# Patient Record
Sex: Female | Born: 1985 | Race: White | Hispanic: No | Marital: Single | Smoking: Never smoker
Health system: Southern US, Community
[De-identification: ages and names within clinical notes are randomized; demographics above are authoritative.]

## PROBLEM LIST (undated history)

## (undated) DIAGNOSIS — F909 Attention-deficit hyperactivity disorder, unspecified type: Secondary | ICD-10-CM

## (undated) DIAGNOSIS — I10 Essential (primary) hypertension: Secondary | ICD-10-CM

## (undated) HISTORY — PX: TUBAL LIGATION: SHX77

---

## 2005-12-17 HISTORY — PX: TUBAL LIGATION: SHX77

## 2017-12-17 DIAGNOSIS — I499 Cardiac arrhythmia, unspecified: Secondary | ICD-10-CM

## 2017-12-17 HISTORY — DX: Cardiac arrhythmia, unspecified: I49.9

## 2018-01-13 ENCOUNTER — Emergency Department (HOSPITAL_COMMUNITY)

## 2018-01-13 ENCOUNTER — Emergency Department (HOSPITAL_COMMUNITY)
Admission: EM | Admit: 2018-01-13 | Discharge: 2018-01-13 | Disposition: A | Discharge status: Home / Self Care | Attending: Emergency Medicine | Admitting: Emergency Medicine

## 2018-01-13 ENCOUNTER — Encounter (HOSPITAL_COMMUNITY): Payer: Self-pay

## 2018-01-13 ENCOUNTER — Other Ambulatory Visit: Payer: Self-pay

## 2018-01-13 DIAGNOSIS — R110 Nausea: Secondary | ICD-10-CM | POA: Insufficient documentation

## 2018-01-13 DIAGNOSIS — R61 Generalized hyperhidrosis: Secondary | ICD-10-CM | POA: Insufficient documentation

## 2018-01-13 DIAGNOSIS — R0789 Other chest pain: Secondary | ICD-10-CM | POA: Insufficient documentation

## 2018-01-13 DIAGNOSIS — R079 Chest pain, unspecified: Secondary | ICD-10-CM

## 2018-01-13 DIAGNOSIS — R42 Dizziness and giddiness: Secondary | ICD-10-CM | POA: Insufficient documentation

## 2018-01-13 LAB — BASIC METABOLIC PANEL
Anion gap: 10 (ref 5–15)
BUN: 10 mg/dL (ref 6–20)
CALCIUM: 9.3 mg/dL (ref 8.9–10.3)
CO2: 24 mmol/L (ref 22–32)
CREATININE: 0.69 mg/dL (ref 0.44–1.00)
Chloride: 102 mmol/L (ref 101–111)
Glucose, Bld: 102 mg/dL — ABNORMAL HIGH (ref 65–99)
Potassium: 3.9 mmol/L (ref 3.5–5.1)
Sodium: 136 mmol/L (ref 135–145)

## 2018-01-13 LAB — CBC
HCT: 38.1 % (ref 36.0–46.0)
Hemoglobin: 12.2 g/dL (ref 12.0–15.0)
MCH: 27.4 pg (ref 26.0–34.0)
MCHC: 32 g/dL (ref 30.0–36.0)
MCV: 85.6 fL (ref 78.0–100.0)
PLATELETS: 253 10*3/uL (ref 150–400)
RBC: 4.45 MIL/uL (ref 3.87–5.11)
RDW: 14.5 % (ref 11.5–15.5)
WBC: 8.5 10*3/uL (ref 4.0–10.5)

## 2018-01-13 LAB — I-STAT BETA HCG BLOOD, ED (MC, WL, AP ONLY)

## 2018-01-13 LAB — I-STAT TROPONIN, ED
TROPONIN I, POC: 0 ng/mL (ref 0.00–0.08)
Troponin i, poc: 0.01 ng/mL (ref 0.00–0.08)

## 2018-01-13 MED ORDER — IBUPROFEN 800 MG PO TABS
800.0000 mg | ORAL_TABLET | Freq: Once | ORAL | Status: AC
  Administered 2018-01-13: 800 mg via ORAL
  Filled 2018-01-13: qty 1

## 2018-01-13 MED ORDER — PANTOPRAZOLE SODIUM 20 MG PO TBEC: 20.0000 mg | DELAYED_RELEASE_TABLET | Freq: Every day | ORAL | 1 refills | Status: AC

## 2018-01-13 NOTE — ED Triage Notes (Signed)
[facility]- pt coming from home with complaint of chest pain. Pt has no cardiac hx. Had 324 of aspirin PTA and 1 nitro which decreased chest pain but caused a headache. Pt AOX4. EKG unremarkable.

## 2018-01-13 NOTE — ED Provider Notes (Signed)
[facility] EMERGENCY DEPARTMENT Provider Note   CSN: [id number] Arrival date & time: 01/13/18  1337     History   Chief Complaint Chief Complaint  Patient presents with  . Chest Pain    HPI Gabriela Herrera is a 32 y.o. female.  32 year old female presents to the emergency department for evaluation of chest pain.  She reports onset of chest pain shortly after noon today.  Pain was sharp, intense, and present in her left chest above her breast.  Pain radiated slightly toward her left shoulder.  She states that pain lasted for a few minutes before improving.  It has been waxing and waning since this time without known aggravating factors.  Pain, overall, has improved.  She was given 324 mg aspirin by EMS as well as 1 sublingual nitroglycerin tablet.  Patient reports some improvement in her pain following oral nitroglycerin.  She states that she has had similar chest pain a few times a month for the past few years.  She is unsure of what causes onset of her discomfort, but today felt more severe.  Symptoms associated with nausea, diaphoresis, lightheadedness.  She has had no syncope, hemoptysis, leg swelling, fevers.  She denies the use of birth control.  No recent surgeries or hospitalizations.  No personal history of hypertension, dyslipidemia, diabetes.  No immediate family history of CAD or ACS.      No past medical history on file.  There are no active problems to display for this patient.   ** The histories are not reviewed yet. Please review them in the "History" navigator section and refresh this [place].  OB History    No data available       Home Medications    Prior to Admission medications   Medication Sig Start Date End Date Taking? Authorizing Provider  pantoprazole (PROTONIX) 20 MG tablet Take 1 tablet (20 mg total) by mouth daily. 01/13/18   Antonietta Breach, PA-C    Family History No family history on file.  Social History Social History    Tobacco Use  . Smoking status: Never Smoker  . Smokeless tobacco: Never Used  Substance Use Topics  . Alcohol use: No    Frequency: Never  . Drug use: No     Allergies   Codeine   Review of Systems Review of Systems Ten systems reviewed and are negative for acute change, except as noted in the HPI.    Physical Exam Updated Vital Signs BP 130/86   Pulse 64   Temp 98 F (36.7 C) (Oral)   Resp (!) 21   Ht 5\' 8"  (1.727 m)   Wt 89.8 kg (198 lb)   LMP 01/01/2018 (Within Days)   SpO2 99%   BMI 30.11 kg/m   Physical Exam  Constitutional: She is oriented to person, place, and time. She appears well-developed and well-nourished. No distress.  Nontoxic appearing and in no acute distress  HENT:  Head: Normocephalic and atraumatic.  Eyes: Conjunctivae and EOM are normal. No scleral icterus.  Neck: Normal range of motion.  Cardiovascular: Normal rate, regular rhythm and intact distal pulses.  Pulmonary/Chest: Effort normal. No stridor. No respiratory distress. She has no wheezes. She has no rales.  Respirations even and unlabored.  Lungs clear to auscultation bilaterally.  Musculoskeletal: Normal range of motion.  No bilateral lower extremity edema  Neurological: She is alert and oriented to person, place, and time. She exhibits normal muscle tone. Coordination normal.  Skin: Skin is warm  and dry. No rash noted. She is not diaphoretic. No erythema. No pallor.  Psychiatric: She has a normal mood and affect. Her behavior is normal.  Nursing note and vitals reviewed.    ED Treatments / Results  Labs (all labs ordered are listed, but only abnormal results are displayed) Labs Reviewed  BASIC METABOLIC PANEL - Abnormal; Notable for the following components:      Result Value   Glucose, Bld 102 (*)    All other components within normal limits  CBC  I-STAT TROPONIN, ED  I-STAT BETA HCG BLOOD, ED ([facility], [facility], [facility] ONLY)  I-STAT TROPONIN, ED    EKG  EKG  Interpretation  Date/Time:  Monday January 13 2018 13:53:32 EST Ventricular Rate:  67 PR Interval:  154 QRS Duration: 76 QT Interval:  384 QTC Calculation: 405 R Axis:   24 Text Interpretation:  Normal sinus rhythm Normal ECG NO STEMI No old tracing to compare Confirmed by Addison Lank [phone]) on 01/13/2018 3:42:14 PM       Radiology Dg Chest 2 View  Result Date: 01/13/2018 CLINICAL DATA:  Hervey Ard left-sided chest pain. EXAM: CHEST  2 VIEW COMPARISON:  None. FINDINGS: The heart size and mediastinal contours are within normal limits. Both lungs are clear. The visualized skeletal structures are unremarkable. IMPRESSION: No active cardiopulmonary disease. Electronically Signed   By: San Morelle M.D.   On: 01/13/2018 15:37    Procedures Procedures (including critical care time)  Medications Ordered in ED Medications  ibuprofen (ADVIL,MOTRIN) tablet 800 mg (800 mg Oral Given 01/13/18 2055)     Initial Impression / Assessment and Plan / ED Course  I have reviewed the triage vital signs and the nursing notes.  Pertinent labs & imaging results that were available during my care of the patient were reviewed by me and considered in my medical decision making (see chart for details).     32 year old female presents to the emergency department for evaluation of chest pain.  Pain is chronic and intermittent, occurring every 1-2 weeks.  She states the pain became more severe today.  Patient with heart score consistent with low risk of acute coronary event.  Her cardiac workup today is reassuring with a negative troponin x2.  EKG shows no signs of acute ischemia.  Chest x-ray offers further reassurance without significant cardiopulmonary abnormality.  Patient has no mediastinal widening to suggest dissection.  She is PERC negative with low concern for pulmonary embolus at this time.  Given chronicity of symptoms, plan for outpatient follow-up with cardiology.  The patient has been given  a referral to [facility] as well as a financial assist resource guide to assist her in finding a primary care doctor.  She reports a history of reflux.  Unable to exclude this as cause of her pain today.  Will start on Protonix.  Have also discussed the possibility of Prinzmetal angina with the patient.  She verbalizes understanding.  MSK thought less likely as symptoms not reproducible on palpation.  Return precautions discussed and provided.  Patient discharged in stable condition with no unaddressed concerns.   Final Clinical Impressions(s) / ED Diagnoses   Final diagnoses:  Nonspecific chest pain    ED Discharge Orders        Ordered    pantoprazole (PROTONIX) 20 MG tablet  Daily     01/13/18 2108       Antonietta Breach, PA-C 01/13/18 2220    Duffy Bruce, MD 01/13/18 2229

## 2018-01-13 NOTE — Discharge Instructions (Signed)
Your workup did not reveal a concerning cause of your chest pain today.  We recommend that you follow-up with a primary care doctor as well as a cardiologist for further evaluation of symptoms.  Given your history of reflux, we advised that you take Protonix daily as prescribed.  You may return to the emergency department, as needed, for new or concerning symptoms.

## 2018-08-14 ENCOUNTER — Emergency Department (HOSPITAL_COMMUNITY)
Admission: EM | Admit: 2018-08-14 | Discharge: 2018-08-15 | Disposition: A | Discharge status: Home / Self Care | Attending: Emergency Medicine | Admitting: Emergency Medicine

## 2018-08-14 ENCOUNTER — Other Ambulatory Visit: Payer: Self-pay

## 2018-08-14 ENCOUNTER — Encounter (HOSPITAL_COMMUNITY): Payer: Self-pay | Admitting: Emergency Medicine

## 2018-08-14 ENCOUNTER — Emergency Department (HOSPITAL_COMMUNITY)

## 2018-08-14 DIAGNOSIS — R51 Headache: Secondary | ICD-10-CM | POA: Insufficient documentation

## 2018-08-14 DIAGNOSIS — Z79899 Other long term (current) drug therapy: Secondary | ICD-10-CM | POA: Diagnosis not present

## 2018-08-14 DIAGNOSIS — I10 Essential (primary) hypertension: Secondary | ICD-10-CM | POA: Insufficient documentation

## 2018-08-14 DIAGNOSIS — R519 Headache, unspecified: Secondary | ICD-10-CM

## 2018-08-14 HISTORY — DX: Essential (primary) hypertension: I10

## 2018-08-14 LAB — DIFFERENTIAL
Abs Immature Granulocytes: 0 10*3/uL (ref 0.0–0.1)
BASOS ABS: 0 10*3/uL (ref 0.0–0.1)
BASOS PCT: 0 %
EOS PCT: 1 %
Eosinophils Absolute: 0.1 10*3/uL (ref 0.0–0.7)
IMMATURE GRANULOCYTES: 0 %
Lymphocytes Relative: 42 %
Lymphs Abs: 4.1 10*3/uL — ABNORMAL HIGH (ref 0.7–4.0)
Monocytes Absolute: 0.6 10*3/uL (ref 0.1–1.0)
Monocytes Relative: 6 %
Neutro Abs: 5 10*3/uL (ref 1.7–7.7)
Neutrophils Relative %: 51 %

## 2018-08-14 LAB — I-STAT TROPONIN, ED: Troponin i, poc: 0.02 ng/mL (ref 0.00–0.08)

## 2018-08-14 LAB — COMPREHENSIVE METABOLIC PANEL
ALT: 19 U/L (ref 0–44)
AST: 20 U/L (ref 15–41)
Albumin: 3.9 g/dL (ref 3.5–5.0)
Alkaline Phosphatase: 56 U/L (ref 38–126)
Anion gap: 9 (ref 5–15)
BUN: 7 mg/dL (ref 6–20)
CALCIUM: 9.4 mg/dL (ref 8.9–10.3)
CHLORIDE: 104 mmol/L (ref 98–111)
CO2: 27 mmol/L (ref 22–32)
CREATININE: 0.87 mg/dL (ref 0.44–1.00)
Glucose, Bld: 93 mg/dL (ref 70–99)
Potassium: 3.7 mmol/L (ref 3.5–5.1)
Sodium: 140 mmol/L (ref 135–145)
Total Bilirubin: 0.5 mg/dL (ref 0.3–1.2)
Total Protein: 7 g/dL (ref 6.5–8.1)

## 2018-08-14 LAB — CBC
HEMATOCRIT: 36.8 % (ref 36.0–46.0)
Hemoglobin: 11.5 g/dL — ABNORMAL LOW (ref 12.0–15.0)
MCH: 28.5 pg (ref 26.0–34.0)
MCHC: 31.3 g/dL (ref 30.0–36.0)
MCV: 91.1 fL (ref 78.0–100.0)
Platelets: 264 10*3/uL (ref 150–400)
RBC: 4.04 MIL/uL (ref 3.87–5.11)
RDW: 12.1 % (ref 11.5–15.5)
WBC: 10 10*3/uL (ref 4.0–10.5)

## 2018-08-14 LAB — I-STAT CHEM 8, ED
BUN: 8 mg/dL (ref 6–20)
CALCIUM ION: 1.14 mmol/L — AB (ref 1.15–1.40)
Chloride: 101 mmol/L (ref 98–111)
Creatinine, Ser: 0.9 mg/dL (ref 0.44–1.00)
GLUCOSE: 89 mg/dL (ref 70–99)
HCT: 35 % — ABNORMAL LOW (ref 36.0–46.0)
HEMOGLOBIN: 11.9 g/dL — AB (ref 12.0–15.0)
POTASSIUM: 3.6 mmol/L (ref 3.5–5.1)
Sodium: 140 mmol/L (ref 135–145)
TCO2: 26 mmol/L (ref 22–32)

## 2018-08-14 LAB — I-STAT BETA HCG BLOOD, ED (MC, WL, AP ONLY)

## 2018-08-14 LAB — PROTIME-INR
INR: 1.03
Prothrombin Time: 13.4 seconds (ref 11.4–15.2)

## 2018-08-14 LAB — APTT: APTT: 30 s (ref 24–36)

## 2018-08-14 MED ORDER — SODIUM CHLORIDE 0.9 % IV BOLUS (SEPSIS)
1000.0000 mL | Freq: Once | INTRAVENOUS | Status: AC
  Administered 2018-08-14: 1000 mL via INTRAVENOUS

## 2018-08-14 MED ORDER — DIPHENHYDRAMINE HCL 50 MG/ML IJ SOLN
25.0000 mg | Freq: Once | INTRAMUSCULAR | Status: AC
  Administered 2018-08-14: 25 mg via INTRAVENOUS
  Filled 2018-08-14: qty 1

## 2018-08-14 MED ORDER — ONDANSETRON 4 MG PO TBDP
4.0000 mg | ORAL_TABLET | Freq: Once | ORAL | Status: AC
  Administered 2018-08-14: 4 mg via ORAL
  Filled 2018-08-14: qty 1

## 2018-08-14 MED ORDER — IOPAMIDOL (ISOVUE-370) INJECTION 76%
50.0000 mL | Freq: Once | INTRAVENOUS | Status: AC | PRN
  Administered 2018-08-14: 50 mL via INTRAVENOUS

## 2018-08-14 MED ORDER — METOCLOPRAMIDE HCL 5 MG/ML IJ SOLN
10.0000 mg | Freq: Once | INTRAMUSCULAR | Status: AC
  Administered 2018-08-14: 10 mg via INTRAVENOUS
  Filled 2018-08-14: qty 2

## 2018-08-14 MED ORDER — IOPAMIDOL (ISOVUE-370) INJECTION 76%
INTRAVENOUS | Status: AC
  Filled 2018-08-14: qty 50

## 2018-08-14 NOTE — ED Triage Notes (Signed)
Pt sent by PCP. Pt reports having a sudden onset of headache, blurred vision, nausea,  Feeling "foggy" around 3pm today. denies vomiting. Pt feels sleepy.

## 2018-08-14 NOTE — ED Notes (Signed)
Patient transported to CT 

## 2018-08-14 NOTE — ED Provider Notes (Signed)
[facility] EMERGENCY DEPARTMENT Provider Note   CSN: [id number] Arrival date & time: 08/14/18  1623     History   Chief Complaint Chief Complaint  Patient presents with  . Headache    HPI Gabriela Herrera is a 32 y.o. female.  HPI   Gabriela Herrera is a 32 year old female with a history of hypertension and GERD who presents to the emergency department from urgent care for evaluation of sudden onset headache.  Patient reports that she was driving earlier today around 3 PM when she suddenly felt a popping sensation in the left parietal region.  She had immediate 10/10 severity headache which she states is the worst headache of her life.  Shortly after she had bilateral blurry vision.  States that she felt nauseated and dizzy.  She states that it was hard to get words out.  She denies slurred speech.  Went to the urgent care where she was told that she needed to come to the ER for immediate evaluation.  Her headache now is left-sided and 5/10 in severity.  She uses words like "sharp, pressure and cold" to describe the headache.  It is worsened when she moves her head.  No alleviating factors.  She reports that she denies numbness, weakness, slurred speech, diplopia, peripheral field loss, neck pain, vomiting, fever, chills, chest pain, shortness of breath, abdominal pain.  She has had headaches in the past, but nothing like this.  She did not take any medications prior to arrival.  Past Medical History:  Diagnosis Date  . Hypertension     There are no active problems to display for this patient.   Past Surgical History:  Procedure Laterality Date  . TUBAL LIGATION       OB History   None      Home Medications    Prior to Admission medications   Medication Sig Start Date End Date Taking? Authorizing Provider  pantoprazole (PROTONIX) 20 MG tablet Take 1 tablet (20 mg total) by mouth daily. 01/13/18   Antonietta Breach, PA-C    Family History History reviewed. No  pertinent family history.  Social History Social History   Tobacco Use  . Smoking status: Never Smoker  . Smokeless tobacco: Never Used  Substance Use Topics  . Alcohol use: Yes    Frequency: Never    Comment: occasionally  . Drug use: No     Allergies   Codeine   Review of Systems Review of Systems  Constitutional: Negative for chills and fever.  HENT: Negative for sore throat.   Eyes: Positive for visual disturbance. Negative for pain.  Respiratory: Negative for shortness of breath.   Cardiovascular: Negative for chest pain.  Gastrointestinal: Negative for abdominal pain, nausea and vomiting.  Genitourinary: Negative for difficulty urinating.  Musculoskeletal: Negative for neck pain and neck stiffness.  Skin: Negative for rash.  Neurological: Positive for dizziness, speech difficulty and headaches. Negative for facial asymmetry, weakness, light-headedness and numbness.     Physical Exam Updated Vital Signs BP 108/69   Pulse 61   Temp 99.1 F (37.3 C) (Oral)   Resp 20   Ht 5\' 6"  (1.676 m)   Wt 93.4 kg   LMP 08/12/2018   SpO2 98%   BMI 33.25 kg/m   Physical Exam  Constitutional: She is oriented to person, place, and time. She appears well-developed and well-nourished. No distress.  No acute distress.  HENT:  Head: Normocephalic and atraumatic.  Mouth/Throat: Oropharynx is clear and moist.  Eyes: Pupils are equal, round, and reactive to light. Conjunctivae and EOM are normal. Right eye exhibits no discharge. Left eye exhibits no discharge.  Neck: Normal range of motion. Neck supple.  Full neck range of motion.  No carotid bruit.  Cardiovascular: Normal rate and regular rhythm.  Pulmonary/Chest: Effort normal and breath sounds normal. No stridor. No respiratory distress. She has no wheezes. She has no rales.  Abdominal: Soft. Bowel sounds are normal. There is no tenderness.  Neurological: She is alert and oriented to person, place, and time. Coordination  normal.  Mental Status:  Alert, oriented, thought content appropriate, able to give a coherent history. Speech fluent without evidence of aphasia. Able to follow 2 step commands without difficulty.  Cranial Nerves:  II:  Peripheral visual fields grossly normal, pupils equal, round, reactive to light III,IV, VI: ptosis not present, extra-ocular motions intact bilaterally  V,VII: smile symmetric, facial light touch sensation equal VIII: hearing grossly normal to voice  X: uvula elevates symmetrically  XI: bilateral shoulder shrug symmetric and strong XII: midline tongue extension without fassiculations Motor:  Normal tone. 5/5 in upper and lower extremities bilaterally including strong and equal grip strength and dorsiflexion/plantar flexion Sensory: Sensation to light touch normal in all extremities.  Gait: normal gait and balance CV: distal pulses palpable throughout   Skin: Skin is warm and dry. She is not diaphoretic.  Psychiatric: She has a normal mood and affect. Her behavior is normal.  Nursing note and vitals reviewed.    ED Treatments / Results  Labs (all labs ordered are listed, but only abnormal results are displayed) Labs Reviewed  CBC - Abnormal; Notable for the following components:      Result Value   Hemoglobin 11.5 (*)    All other components within normal limits  DIFFERENTIAL - Abnormal; Notable for the following components:   Lymphs Abs 4.1 (*)    All other components within normal limits  I-STAT CHEM 8, ED - Abnormal; Notable for the following components:   Calcium, Ion 1.14 (*)    Hemoglobin 11.9 (*)    HCT 35.0 (*)    All other components within normal limits  PROTIME-INR  APTT  COMPREHENSIVE METABOLIC PANEL  I-STAT TROPONIN, ED  I-STAT BETA HCG BLOOD, ED ([facility], [facility], [facility] ONLY)  CBG MONITORING, ED    EKG None  Radiology Ct Angio Head W Or Wo Contrast  Result Date: 08/14/2018 CLINICAL DATA:  32 y/o F; sudden onset headache, blurred vision, and nausea.  EXAM: CT ANGIOGRAPHY HEAD AND NECK TECHNIQUE: Multidetector CT imaging of the head and neck was performed using the standard protocol during bolus administration of intravenous contrast. Multiplanar CT image reconstructions and MIPs were obtained to evaluate the vascular anatomy. Carotid stenosis measurements (when applicable) are obtained utilizing NASCET criteria, using the distal internal carotid diameter as the denominator. CONTRAST:  5mL ISOVUE-370 IOPAMIDOL (ISOVUE-370) INJECTION 76% COMPARISON:  08/14/2018 CT head. FINDINGS: CTA NECK FINDINGS Aortic arch: Standard branching. Imaged portion shows no evidence of aneurysm or dissection. No significant stenosis of the major arch vessel origins. Right carotid system: No evidence of dissection, stenosis (50% or greater) or occlusion. Left carotid system: No evidence of dissection, stenosis (50% or greater) or occlusion. Vertebral arteries: Codominant. No evidence of dissection, stenosis (50% or greater) or occlusion. Skeleton: Negative. Other neck: Negative. Upper chest: Negative. Review of the MIP images confirms the above findings CTA HEAD FINDINGS Anterior circulation: No significant stenosis, proximal occlusion, aneurysm, or vascular malformation. Posterior circulation: No  significant stenosis, proximal occlusion, aneurysm, or vascular malformation. Venous sinuses: As permitted by contrast timing, patent. Anatomic variants: Complete circle-of-Willis. Delayed phase: No abnormal intracranial enhancement. The focus of hypoattenuation within the left anterior frontal lobe does not demonstrate enhancement. Review of the MIP images confirms the above findings IMPRESSION: 1. Patent carotid and vertebral arteries. No dissection, aneurysm, or hemodynamically significant stenosis utilizing NASCET criteria. 2. Patent anterior and posterior intracranial circulation. No large vessel occlusion, aneurysm, or significant stenosis. 3. No abnormal enhancement of the brain  identified. Electronically Signed   By: Kristine Garbe M.D.   On: 08/14/2018 23:34   Ct Head Wo Contrast  Result Date: 08/14/2018 CLINICAL DATA:  32 year old female with acute headache, blurred vision and nausea today. EXAM: CT HEAD WITHOUT CONTRAST TECHNIQUE: Contiguous axial images were obtained from the base of the skull through the vertex without intravenous contrast. COMPARISON:  None. FINDINGS: Brain: An equivocal hypodensity within the LEFT frontal subcortical white matter is noted (series 3: Images 20-21). No other intracranial abnormalities are identified including acute infarction, hemorrhage, midline shift, hydrocephalus, extra-axial collection or mass effect. Vascular: No hyperdense vessel or unexpected calcification. Skull: Normal. Negative for fracture or focal lesion. Sinuses/Orbits: No acute finding. Other: None. IMPRESSION: 1. Equivocal LEFT frontal subcortical white matter hypodensity of uncertain chronicity. If symptoms are referable to this area, consider MR. 2. No other abnormalities identified. Electronically Signed   By: Margarette Canada M.D.   On: 08/14/2018 18:49   Ct Angio Neck W And/or Wo Contrast  Result Date: 08/14/2018 CLINICAL DATA:  32 y/o F; sudden onset headache, blurred vision, and nausea. EXAM: CT ANGIOGRAPHY HEAD AND NECK TECHNIQUE: Multidetector CT imaging of the head and neck was performed using the standard protocol during bolus administration of intravenous contrast. Multiplanar CT image reconstructions and MIPs were obtained to evaluate the vascular anatomy. Carotid stenosis measurements (when applicable) are obtained utilizing NASCET criteria, using the distal internal carotid diameter as the denominator. CONTRAST:  36mL ISOVUE-370 IOPAMIDOL (ISOVUE-370) INJECTION 76% COMPARISON:  08/14/2018 CT head. FINDINGS: CTA NECK FINDINGS Aortic arch: Standard branching. Imaged portion shows no evidence of aneurysm or dissection. No significant stenosis of the major arch  vessel origins. Right carotid system: No evidence of dissection, stenosis (50% or greater) or occlusion. Left carotid system: No evidence of dissection, stenosis (50% or greater) or occlusion. Vertebral arteries: Codominant. No evidence of dissection, stenosis (50% or greater) or occlusion. Skeleton: Negative. Other neck: Negative. Upper chest: Negative. Review of the MIP images confirms the above findings CTA HEAD FINDINGS Anterior circulation: No significant stenosis, proximal occlusion, aneurysm, or vascular malformation. Posterior circulation: No significant stenosis, proximal occlusion, aneurysm, or vascular malformation. Venous sinuses: As permitted by contrast timing, patent. Anatomic variants: Complete circle-of-Willis. Delayed phase: No abnormal intracranial enhancement. The focus of hypoattenuation within the left anterior frontal lobe does not demonstrate enhancement. Review of the MIP images confirms the above findings IMPRESSION: 1. Patent carotid and vertebral arteries. No dissection, aneurysm, or hemodynamically significant stenosis utilizing NASCET criteria. 2. Patent anterior and posterior intracranial circulation. No large vessel occlusion, aneurysm, or significant stenosis. 3. No abnormal enhancement of the brain identified. Electronically Signed   By: Kristine Garbe M.D.   On: 08/14/2018 23:34    Procedures Procedures (including critical care time)  Medications Ordered in ED Medications  ondansetron (ZOFRAN-ODT) disintegrating tablet 4 mg (4 mg Oral Given 08/14/18 1752)  sodium chloride 0.9 % bolus 1,000 mL (1,000 mLs Intravenous New Bag/Given 08/14/18 2324)  iopamidol (ISOVUE-370) 76 % injection  50 mL (50 mLs Intravenous Contrast Given 08/14/18 2246)  metoCLOPramide (REGLAN) injection 10 mg (10 mg Intravenous Given 08/14/18 2325)  diphenhydrAMINE (BENADRYL) injection 25 mg (25 mg Intravenous Given 08/14/18 2325)     Initial Impression / Assessment and Plan / ED Course  I  have reviewed the triage vital signs and the nursing notes.  Pertinent labs & imaging results that were available during my care of the patient were reviewed by me and considered in my medical decision making (see chart for details).    Presents with sudden onset headache with associated blurred vision, nausea and dizziness.  No neck pain.  Symptoms started at 3 PM today.  On exam no neurological deficits.  CT head without contrast ordered in triage shows equivocal left frontal subcortical white matter hypodensity of uncertain chronicity.  Symptoms are not referrable to this area and I do not think MRI would be helpful.  Main concern given symptoms is potential SAH.  I evaluated patient 7 hours after initial symptoms have started.  I discussed with patient that LP is the gold standard for evaluating SAH and she declines. Will give migraine cocktail and get CTA of the head and neck for further evaluation.    CTA head and neck negative for dissection, aneurysm or hemodynamically significant stenosis. On recheck patient reports that her headache has resolved.  She denies visual disturbance.  States that she is ready to go home.  I have counseled her on return precautions and she agrees.  Discussed follow-up with her PCP regarding today's ER visit.  I discussed this patient with Dr. Eulis Foster who agrees with plan to discharge home.  Final Clinical Impressions(s) / ED Diagnoses   Final diagnoses:  Bad headache    ED Discharge Orders    None       Bernarda Caffey 08/15/18 0016    Daleen Bo, MD 08/15/18 [phone]

## 2018-08-14 NOTE — ED Provider Notes (Signed)
Patient placed in Quick Look pathway, seen and evaluated   Chief Complaint: headache, severe  HPI: Gabriela Herrera is a 32 y.o. female who presents to the ED with the worst headache of her life. Patient sent by PCP. Pt reports having a sudden onset of headache, blurred vision, nausea,  Feeling "foggy" around 3pm today. denies vomiting. Pt feels sleepy. Patient's mother reports the patient has had change in speech.  ROS: Neuro: dizziness, headache  Physical Exam:  BP 124/89 (BP Location: Right Arm)   Pulse 83   Temp 99.1 F (37.3 C) (Oral)   Resp 16   Ht 5\' 6"  (1.676 m)   Wt 93.4 kg   LMP 08/12/2018   SpO2 100%   BMI 33.25 kg/m    Gen: No distress  Neuro: Awake and Alert  Skin: Warm and dry    Initiation of care has begun. The patient has been counseled on the process, plan, and necessity for staying for the completion/evaluation, and the remainder of the medical screening examination    Ashley Murrain, NP 08/14/18 1711    Hayden Rasmussen, MD 08/15/18 [phone]

## 2018-08-15 NOTE — Discharge Instructions (Signed)
The CT scan of your head and neck was reassuring.  Please follow-up with your regular doctor regarding today's ER visit to potentially start medication to prevent headaches.  Return to the ER if you have any new or concerning symptoms like vomiting, worsening vision, worsening headache.

## 2019-02-23 ENCOUNTER — Other Ambulatory Visit: Payer: Self-pay | Admitting: Urgent Care

## 2019-02-23 DIAGNOSIS — R102 Pelvic and perineal pain: Secondary | ICD-10-CM

## 2019-02-26 ENCOUNTER — Inpatient Hospital Stay: Admission: RE | Admit: 2019-02-26 | Source: Ambulatory Visit

## 2019-02-27 ENCOUNTER — Ambulatory Visit
Admission: RE | Admit: 2019-02-27 | Discharge: 2019-02-27 | Disposition: A | Discharge status: Home / Self Care | Source: Ambulatory Visit | Attending: Urgent Care | Admitting: Urgent Care

## 2019-02-27 DIAGNOSIS — R102 Pelvic and perineal pain: Secondary | ICD-10-CM

## 2019-09-06 ENCOUNTER — Other Ambulatory Visit: Payer: Self-pay

## 2019-09-06 ENCOUNTER — Emergency Department (HOSPITAL_COMMUNITY)

## 2019-09-06 ENCOUNTER — Encounter (HOSPITAL_COMMUNITY): Payer: Self-pay

## 2019-09-06 ENCOUNTER — Emergency Department (HOSPITAL_COMMUNITY)
Admission: EM | Admit: 2019-09-06 | Discharge: 2019-09-06 | Disposition: A | Discharge status: Home / Self Care | Attending: Emergency Medicine | Admitting: Emergency Medicine

## 2019-09-06 DIAGNOSIS — R1110 Vomiting, unspecified: Secondary | ICD-10-CM | POA: Diagnosis present

## 2019-09-06 DIAGNOSIS — I10 Essential (primary) hypertension: Secondary | ICD-10-CM | POA: Insufficient documentation

## 2019-09-06 DIAGNOSIS — R112 Nausea with vomiting, unspecified: Secondary | ICD-10-CM

## 2019-09-06 DIAGNOSIS — N390 Urinary tract infection, site not specified: Secondary | ICD-10-CM | POA: Diagnosis not present

## 2019-09-06 DIAGNOSIS — J069 Acute upper respiratory infection, unspecified: Secondary | ICD-10-CM | POA: Insufficient documentation

## 2019-09-06 DIAGNOSIS — R197 Diarrhea, unspecified: Secondary | ICD-10-CM

## 2019-09-06 DIAGNOSIS — Z79899 Other long term (current) drug therapy: Secondary | ICD-10-CM | POA: Insufficient documentation

## 2019-09-06 LAB — URINALYSIS, ROUTINE W REFLEX MICROSCOPIC
Bilirubin Urine: NEGATIVE
Glucose, UA: NEGATIVE mg/dL
Ketones, ur: NEGATIVE mg/dL
Nitrite: NEGATIVE
Protein, ur: 30 mg/dL — AB
Specific Gravity, Urine: 1.034 — ABNORMAL HIGH (ref 1.005–1.030)
pH: 5 (ref 5.0–8.0)

## 2019-09-06 LAB — COMPREHENSIVE METABOLIC PANEL
ALT: 22 U/L (ref 0–44)
AST: 18 U/L (ref 15–41)
Albumin: 4.3 g/dL (ref 3.5–5.0)
Alkaline Phosphatase: 69 U/L (ref 38–126)
Anion gap: 12 (ref 5–15)
BUN: 13 mg/dL (ref 6–20)
CO2: 28 mmol/L (ref 22–32)
Calcium: 9.8 mg/dL (ref 8.9–10.3)
Chloride: 98 mmol/L (ref 98–111)
Creatinine, Ser: 0.88 mg/dL (ref 0.44–1.00)
GFR calc Af Amer: >60 mL/min (ref >60)
GFR calc non Af Amer: >60 mL/min (ref >60)
Glucose, Bld: 116 mg/dL — ABNORMAL HIGH (ref 70–99)
Potassium: 3 mmol/L — ABNORMAL LOW (ref 3.5–5.1)
Sodium: 138 mmol/L (ref 135–145)
Total Bilirubin: 0.4 mg/dL (ref 0.3–1.2)
Total Protein: 8.5 g/dL — ABNORMAL HIGH (ref 6.5–8.1)

## 2019-09-06 LAB — CBC WITH DIFFERENTIAL/PLATELET
Abs Immature Granulocytes: 0.04 10*3/uL (ref 0.00–0.07)
Basophils Absolute: 0.1 10*3/uL (ref 0.0–0.1)
Basophils Relative: 1 %
Eosinophils Absolute: 0.1 10*3/uL (ref 0.0–0.5)
Eosinophils Relative: 1 %
HCT: 42.1 % (ref 36.0–46.0)
Hemoglobin: 13.5 g/dL (ref 12.0–15.0)
Immature Granulocytes: 0 %
Lymphocytes Relative: 38 %
Lymphs Abs: 4.2 10*3/uL — ABNORMAL HIGH (ref 0.7–4.0)
MCH: 28 pg (ref 26.0–34.0)
MCHC: 32.1 g/dL (ref 30.0–36.0)
MCV: 87.2 fL (ref 80.0–100.0)
Monocytes Absolute: 0.8 10*3/uL (ref 0.1–1.0)
Monocytes Relative: 7 %
Neutro Abs: 5.9 10*3/uL (ref 1.7–7.7)
Neutrophils Relative %: 53 %
Platelets: 241 10*3/uL (ref 150–400)
RBC: 4.83 MIL/uL (ref 3.87–5.11)
RDW: 13.1 % (ref 11.5–15.5)
WBC: 11.1 10*3/uL — ABNORMAL HIGH (ref 4.0–10.5)
nRBC: 0 % (ref 0.0–0.2)

## 2019-09-06 LAB — I-STAT BETA HCG BLOOD, ED (MC, WL, AP ONLY): I-stat hCG, quantitative: <5 m[IU]/mL (ref <5)

## 2019-09-06 LAB — GROUP A STREP BY PCR: Group A Strep by PCR: NOT DETECTED

## 2019-09-06 LAB — MONONUCLEOSIS SCREEN: Mono Screen: NEGATIVE

## 2019-09-06 MED ORDER — ONDANSETRON HCL 4 MG/2ML IJ SOLN
4.0000 mg | Freq: Once | INTRAMUSCULAR | Status: AC
  Administered 2019-09-06: 4 mg via INTRAVENOUS
  Filled 2019-09-06: qty 2

## 2019-09-06 MED ORDER — SODIUM CHLORIDE 0.9 % IV SOLN
1.0000 g | Freq: Once | INTRAVENOUS | Status: AC
  Administered 2019-09-06: 06:00:00 1 g via INTRAVENOUS
  Filled 2019-09-06: qty 10

## 2019-09-06 MED ORDER — POTASSIUM CHLORIDE CRYS ER 20 MEQ PO TBCR
40.0000 meq | EXTENDED_RELEASE_TABLET | Freq: Once | ORAL | Status: AC
  Administered 2019-09-06: 40 meq via ORAL
  Filled 2019-09-06: qty 2

## 2019-09-06 MED ORDER — ONDANSETRON 4 MG PO TBDP: 4.0000 mg | ORAL_TABLET | Freq: Four times a day (QID) | ORAL | 0 refills | Status: DC | PRN

## 2019-09-06 MED ORDER — KETOROLAC TROMETHAMINE 30 MG/ML IJ SOLN
30.0000 mg | Freq: Once | INTRAMUSCULAR | Status: AC
  Administered 2019-09-06: 30 mg via INTRAVENOUS
  Filled 2019-09-06: qty 1

## 2019-09-06 MED ORDER — DEXAMETHASONE SODIUM PHOSPHATE 10 MG/ML IJ SOLN
10.0000 mg | Freq: Once | INTRAMUSCULAR | Status: AC
  Administered 2019-09-06: 10 mg via INTRAVENOUS
  Filled 2019-09-06: qty 1

## 2019-09-06 MED ORDER — CEFDINIR 300 MG PO CAPS: 300.0000 mg | ORAL_CAPSULE | Freq: Two times a day (BID) | ORAL | 0 refills | Status: DC

## 2019-09-06 MED ORDER — SODIUM CHLORIDE 0.9 % IV BOLUS (SEPSIS)
1000.0000 mL | Freq: Once | INTRAVENOUS | Status: AC
  Administered 2019-09-06: 1000 mL via INTRAVENOUS

## 2019-09-06 NOTE — ED Triage Notes (Addendum)
Pt reports vomiting, coughing, and sore throat all week. Also reports white spots on tonsils. She reports a hx of getting strep throat often. She was strep and COVID tested on Monday and both were negative. She states that her kidneys are starting to get sore because she hasnt been able to keep fluids down. She also endorses upper back pain.

## 2019-09-06 NOTE — ED Provider Notes (Signed)
TIME SEEN: 3:10 AM  CHIEF COMPLAINT: Fever, cough, sore throat, bilateral flank pain, vomiting, diarrhea  HPI: Patient is a 33 year old female with history of hypertension, recurrent strep pharyngitis who presents to the emergency department with 1 week of fevers, nonproductive cough, sore throat.  Was seen 6 days ago and had a negative COVID swab and negative strep test.  States she has not had a fever in 2 days but is now having vomiting, diarrhea and feels "sore over my kidneys".  Denies dysuria or hematuria.  No vaginal bleeding or discharge.  No abdominal pain.  No COVID exposures.  ROS: See HPI Constitutional:  fever  Eyes: no drainage  ENT: no runny nose   Cardiovascular:  no chest pain  Resp: no SOB  GI: diarrhea and vomiting GU: no dysuria Integumentary: no rash  Allergy: no hives  Musculoskeletal: no leg swelling  Neurological: no slurred speech ROS otherwise negative  PAST MEDICAL HISTORY/PAST SURGICAL HISTORY:  Past Medical History:  Diagnosis Date  . Hypertension     MEDICATIONS:  Prior to Admission medications   Medication Sig Start Date End Date Taking? Authorizing Provider  hydrochlorothiazide (HYDRODIURIL) 25 MG tablet Take 25 mg by mouth daily.    [provider]  lisinopril (PRINIVIL,ZESTRIL) 5 MG tablet Take 5 mg by mouth every evening.    [provider]  pantoprazole (PROTONIX) 20 MG tablet Take 1 tablet (20 mg total) by mouth daily. 01/13/18   Antonietta Breach, PA-C    ALLERGIES:  Allergies  Allergen Reactions  . Codeine Anaphylaxis    SOCIAL HISTORY:  Social History   Tobacco Use  . Smoking status: Never Smoker  . Smokeless tobacco: Never Used  Substance Use Topics  . Alcohol use: Yes    Frequency: Never    Comment: occasionally    FAMILY HISTORY: History reviewed. No pertinent family history.  EXAM: BP 131/77 (BP Location: Left Arm)   Pulse (!) 110   Temp 98.9 F (37.2 C) (Oral)   Resp 20   SpO2 99%  CONSTITUTIONAL:  Alert and oriented and responds appropriately to questions. Well-appearing; well-nourished HEAD: Normocephalic EYES: Conjunctivae clear, pupils appear equal, EOMI ENT: normal nose; moist mucous membranes; No pharyngeal erythema or petechiae, moderate bilateral tonsillar hypertrophy with exudate, no uvular deviation, no unilateral swelling, no trismus or drooling, no muffled voice, normal phonation, no stridor, no dental caries present, no drainable dental abscess noted, no Ludwig's angina, tongue sits flat in the bottom of the mouth, no angioedema, no facial erythema or warmth, no facial swelling; no pain with movement of the neck. NECK: Supple, no meningismus, no nuchal rigidity, no LAD  CARD: RRR; S1 and S2 appreciated; no murmurs, no clicks, no rubs, no gallops RESP: Normal chest excursion without splinting or tachypnea; breath sounds clear and equal bilaterally; no wheezes, no rhonchi, no rales, no hypoxia or respiratory distress, speaking full sentences ABD/GI: Normal bowel sounds; non-distended; soft, non-tender, no rebound, no guarding, no peritoneal signs, no hepatosplenomegaly BACK:  The back appears normal and is non-tender to palpation, there is no CVA tenderness EXT: Normal ROM in all joints; non-tender to palpation; no edema; normal capillary refill; no cyanosis, no calf tenderness or swelling    SKIN: Normal color for age and race; warm; no rash NEURO: Moves all extremities equally PSYCH: The patient's mood and manner are appropriate. Grooming and personal hygiene are appropriate.  MEDICAL DECISION MAKING: Patient here with symptoms that started off as a viral URI.  She continues to have  a nonproductive cough.  She reports history of recurrent strep and states she is concerned that she has strep pharyngitis again today.  Differential also includes mononucleosis, COVID, other viral process.  Will obtain repeat strep swab, chest x-ray.  Will treat symptoms with Toradol, Decadron.  No signs  of PTA, deep space neck infection, meningitis.  She has no hypoxia or increased work of breathing.  Now also complaining of vomiting and diarrhea.  Abdominal exam is benign.  States she is having some flank pain but no dysuria.  I do not feel she needs CT imaging of her abdomen at this time.  Low suspicion for appendicitis, perforation, diverticulitis, colitis, cholecystitis, pancreatitis.  Will obtain urinalysis, urine culture.  ED PROGRESS: Patient's labs show mild leukocytosis of 11,000 with predominant lymphocytes.  Potassium slightly low at 3.0.  Given oral replacement.  Pregnancy test is negative.  Urine appears infected today.  Strep test negative.  Mono negative.  Chest x-ray clear.  Patient reports feeling much better after IV fluids, Toradol, Zofran.  She is able to tolerate p.o. without further vomiting.  I recommended antibiotics for the next 10 days to cover for possible pyelonephritis and complaints of fevers, flank pain, vomiting.  I feel she is safe to be discharged home for outpatient management and she is also comfortable with this plan.  Have offered repeat COVID swab although I feel this is less likely given recent negative swab a week ago with infected appearing urine here today to explain some of her symptoms.  She would prefer to avoid repeat COVID swab today.  We did discuss that COVID is a viral illness does not have any specific treatment other than supportive measures at home unless symptoms worsened and she need to return to the emergency department.  Will provide with work note.  Discussed return precautions with patient and significant other.  Will discharge with cefdinir and Zofran.  At this time, I do not feel there is any life-threatening condition present. I have reviewed and discussed all results (EKG, imaging, lab, urine as appropriate) and exam findings with patient/family. I have reviewed nursing notes and appropriate previous records.  I feel the patient is safe to be  discharged home without further emergent workup and can continue workup as an outpatient as needed. Discussed usual and customary return precautions. Patient/family verbalize understanding and are comfortable with this plan.  Outpatient follow-up has been provided as needed. All questions have been answered.      Maddex Garlitz, Delice Bison, DO 09/06/19 [phone]

## 2019-09-06 NOTE — Discharge Instructions (Signed)
You may alternate Tylenol 1000 mg every 6 hours as needed for pain and Ibuprofen 800 mg every 8 hours as needed for pain.  Please take Ibuprofen with food.  You may use over-the-counter Imodium as needed for diarrhea.  You may use over-the-counter Mucinex as needed for cough.  Please rest and increase your fluid intake over the next several days.  Steps to find a Primary Care Provider (PCP):  Call [phone] or [phone] to access "[place] a Doctor Service."  2.  You may also go on the [facility] website at [email or web address]  3.  [facility] also frequently accepts new patients.  [place] [phone]  4.  There are also multiple [facility], [facility] and [facility] practices throughout the [place] that are frequently accepting new patients. You may find a clinic that is close to your home and contact them.  [facility] [email or web address] [phone]  [facility] [email or web address]  [facility] [email or web address] [place] [email or web address] [phone]  5.  Local Health Departments also can provide primary care services.  [facility]  [place] [id number] [phone]  [facility] [place] [address] [place] [phone]

## 2019-09-07 LAB — URINE CULTURE

## 2020-06-23 ENCOUNTER — Other Ambulatory Visit: Payer: Self-pay | Admitting: Urgent Care

## 2020-06-23 DIAGNOSIS — R9389 Abnormal findings on diagnostic imaging of other specified body structures: Secondary | ICD-10-CM

## 2020-07-17 ENCOUNTER — Ambulatory Visit
Admission: RE | Admit: 2020-07-17 | Discharge: 2020-07-17 | Disposition: A | Discharge status: Home / Self Care | Source: Ambulatory Visit | Attending: Urgent Care | Admitting: Urgent Care

## 2020-07-17 ENCOUNTER — Other Ambulatory Visit: Payer: Self-pay

## 2020-07-17 DIAGNOSIS — R9389 Abnormal findings on diagnostic imaging of other specified body structures: Secondary | ICD-10-CM

## 2020-07-17 MED ORDER — GADOBENATE DIMEGLUMINE 529 MG/ML IV SOLN
20.0000 mL | Freq: Once | INTRAVENOUS | Status: AC | PRN
  Administered 2020-07-17: 20 mL via INTRAVENOUS

## 2020-08-09 ENCOUNTER — Ambulatory Visit (INDEPENDENT_AMBULATORY_CARE_PROVIDER_SITE_OTHER): Admitting: Neurology

## 2020-08-09 ENCOUNTER — Other Ambulatory Visit: Payer: Self-pay

## 2020-08-09 ENCOUNTER — Encounter: Payer: Self-pay | Admitting: Neurology

## 2020-08-09 VITALS — BP 118/84 | HR 88 | Ht 67.5 in | Wt 214.0 lb

## 2020-08-09 DIAGNOSIS — R519 Headache, unspecified: Secondary | ICD-10-CM

## 2020-08-09 DIAGNOSIS — M62838 Other muscle spasm: Secondary | ICD-10-CM

## 2020-08-09 DIAGNOSIS — R9089 Other abnormal findings on diagnostic imaging of central nervous system: Secondary | ICD-10-CM | POA: Diagnosis not present

## 2020-08-09 DIAGNOSIS — C719 Malignant neoplasm of brain, unspecified: Secondary | ICD-10-CM | POA: Diagnosis not present
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
## 2020-08-09 NOTE — Patient Instructions (Addendum)
Your exam is reassuring.   For your recurrent headaches I would recommend as our first step that you take your gabapentin as prescribed. You are currently only taking it once a day and it is prescribed for 3 times a day, it may help with the headaches and also help you sleep better at night.  We will consider topiramate or Topamax next 4 headache management.  As discussed, I will make a referral for you to see a neurosurgeon as they are better equipped in monitoring your brain lesion and make suggestions as to how frequently you need a brain scan and if any other intervention is required.  We will do an EEG (brainwave test), which we will schedule. We will call you with the results.  Please follow-up in about 2 months. Please try to hydrate well with water, 6 to 8 cups/day are recommended, 8 oz. each, limit your caffeine to up to 2 servings per day. Try to get enough rest and about 7 to 8 h of sleep. If you have snoring and do not sleep well or have daytime tiredness we can consider a sleep study in the near future. We will pick up our discussion at the next visit.

## 2020-08-25 ENCOUNTER — Other Ambulatory Visit

## 2020-10-11 ENCOUNTER — Ambulatory Visit: Admitting: Neurology

## 2020-12-22 ENCOUNTER — Telehealth: Payer: Self-pay | Admitting: Neurology

## 2020-12-22 NOTE — Telephone Encounter (Signed)
I did not ask patient to have another brain MRI, she recently had one, which was ordered by PCP.  I made a referral to neurosurgery.

## 2020-12-22 NOTE — Telephone Encounter (Signed)
Called PCP, no answer. Called patient and LVM advising her that Dr Frances Furbish did not say she needs another MRI. I advised she has already had one ordered by PCP. I informed her Dr Frances Furbish has referred her to a neurosurgeon. Left # for questions.

## 2020-12-22 NOTE — Telephone Encounter (Signed)
Pt.'s PCP Guy Sandifer, PA called to see if she is supposed to be ordering the MRI. She states according to pt. she was told that her PCP needed to do it. Alphonzo Lemmings asks is this supposed to be routine or without contrast. Please advise.  She gave office number, but she can also be reached on her cell at [phone]

## 2021-01-10 ENCOUNTER — Other Ambulatory Visit: Payer: Self-pay | Admitting: Neurological Surgery

## 2021-01-10 DIAGNOSIS — D496 Neoplasm of unspecified behavior of brain: Secondary | ICD-10-CM

## 2021-02-08 ENCOUNTER — Ambulatory Visit
Admission: RE | Admit: 2021-02-08 | Discharge: 2021-02-08 | Disposition: A | Discharge status: Home / Self Care | Payer: BC Managed Care – PPO | Source: Ambulatory Visit | Attending: Neurological Surgery | Admitting: Neurological Surgery

## 2021-02-08 DIAGNOSIS — D496 Neoplasm of unspecified behavior of brain: Secondary | ICD-10-CM

## 2021-02-08 MED ORDER — GADOBENATE DIMEGLUMINE 529 MG/ML IV SOLN
20.0000 mL | Freq: Once | INTRAVENOUS | Status: AC | PRN
  Administered 2021-02-08: 20 mL via INTRAVENOUS

## 2021-02-15 ENCOUNTER — Telehealth: Payer: Self-pay | Admitting: Neurology

## 2021-02-15 NOTE — Telephone Encounter (Signed)
I received an office note from Dr. Zada Finders, neurosurgery, patient saw him on 02/09/2021 for follow-up.  Her MRI brain was discussed and she was advised that it showed stable findings from 6 months prior.  She was advised to have a repeat brain MRI in 1 year.  FYI, nothing further needed.

## 2021-05-11 ENCOUNTER — Other Ambulatory Visit: Payer: Self-pay | Admitting: Radiation Therapy

## 2021-05-24 ENCOUNTER — Telehealth: Payer: Self-pay | Admitting: Radiation Therapy

## 2021-05-24 NOTE — Telephone Encounter (Signed)
Spoke with pt about her upcoming visit with Dr. Mickeal Skinner on 6/23. She has my contact information in case she has any questions or concerns that come up before her appointment.   Mont Dutton R.T.(R)(T) Radiation Special Procedures Navigator

## 2021-06-08 ENCOUNTER — Inpatient Hospital Stay: Payer: BC Managed Care – PPO | Attending: Internal Medicine | Admitting: Internal Medicine

## 2021-06-08 ENCOUNTER — Other Ambulatory Visit: Payer: Self-pay

## 2021-06-08 DIAGNOSIS — R110 Nausea: Secondary | ICD-10-CM | POA: Diagnosis not present

## 2021-06-08 DIAGNOSIS — R410 Disorientation, unspecified: Secondary | ICD-10-CM | POA: Insufficient documentation

## 2021-06-08 DIAGNOSIS — Z79899 Other long term (current) drug therapy: Secondary | ICD-10-CM

## 2021-06-08 DIAGNOSIS — G939 Disorder of brain, unspecified: Secondary | ICD-10-CM | POA: Diagnosis present

## 2021-06-08 DIAGNOSIS — R519 Headache, unspecified: Secondary | ICD-10-CM | POA: Diagnosis not present

## 2021-06-08 DIAGNOSIS — R569 Unspecified convulsions: Secondary | ICD-10-CM | POA: Insufficient documentation

## 2021-06-08 DIAGNOSIS — R9089 Other abnormal findings on diagnostic imaging of central nervous system: Secondary | ICD-10-CM

## 2021-06-08 MED ORDER — LEVETIRACETAM 1000 MG PO TABS: 1000.0000 mg | ORAL_TABLET | Freq: Two times a day (BID) | ORAL | 0 refills | Status: DC
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
## 2021-06-21 ENCOUNTER — Telehealth: Payer: Self-pay | Admitting: *Deleted

## 2021-06-21 ENCOUNTER — Telehealth: Payer: Self-pay | Admitting: Radiation Therapy

## 2021-06-21 NOTE — Telephone Encounter (Signed)
Pt left a voicemail requesting a call back with instructions/recommendations.   Has been taking Keppra for 6 days and now has noticed reddening of the skin on her face and itching from head to toe. She is going to take some Benadryl. She spoke with her pharmacist and he feels it could be the [place], and instructed her to call Dr. Renda Rolls office for recommendations.   I have routed this message to Dr. Mickeal Skinner and his nurse Rhae Hammock.   Mont Dutton R.T.(R)(T) Radiation Special Procedures Navigator

## 2021-06-21 NOTE — Telephone Encounter (Signed)
TCT patient as she had called susan Boyles in [place] about a problem with her Keppra. Pt states she has been on Keppra for about 6 days and developed a red rash all over  with severe itching-head to toe. She has tried benadryl at night and that helps abit but cannot take it while at work.  She did not take Keppra last night or this morning. Advised to take Pepcid Q 12 hours as well to help with allergic reaction. Advised that Dr. Mickeal Skinner is not in the office today but that I would send the message with high priority in case he checks his messages today. Advised that he will see it for sure in the morning.  Pt states she can wait til tomorrow. No seizure activity noted at this time.

## 2021-06-22 ENCOUNTER — Telehealth: Payer: Self-pay | Admitting: *Deleted

## 2021-06-22 NOTE — Telephone Encounter (Signed)
Spoke with the patient and advised her to stop the Keppra as Dr Mickeal Skinner instructed.   Moved up her appointment with Dr Mickeal Skinner because she states that her extraneous movement did stop with the [place] so she felt that there was improvement aside from the rash/itching.

## 2021-06-29 ENCOUNTER — Inpatient Hospital Stay: Payer: BC Managed Care – PPO | Attending: Internal Medicine | Admitting: Internal Medicine

## 2021-06-29 ENCOUNTER — Other Ambulatory Visit: Payer: Self-pay

## 2021-06-29 VITALS — BP 123/62 | HR 74 | Temp 99.1°F | Resp 18 | Ht 67.5 in | Wt 215.9 lb

## 2021-06-29 DIAGNOSIS — R519 Headache, unspecified: Secondary | ICD-10-CM | POA: Insufficient documentation

## 2021-06-29 DIAGNOSIS — G939 Disorder of brain, unspecified: Secondary | ICD-10-CM | POA: Diagnosis present

## 2021-06-29 DIAGNOSIS — R569 Unspecified convulsions: Secondary | ICD-10-CM | POA: Diagnosis not present

## 2021-06-29 DIAGNOSIS — R9089 Other abnormal findings on diagnostic imaging of central nervous system: Secondary | ICD-10-CM

## 2021-06-29 MED ORDER — LAMOTRIGINE 100 MG PO TABS: 100.0000 mg | ORAL_TABLET | Freq: Every day | ORAL | 1 refills | Status: DC

## 2021-06-29 MED ORDER — LAMOTRIGINE 25 MG PO TABS: ORAL_TABLET | ORAL | 0 refills | Status: DC

## 2021-06-29 MED ORDER — LORAZEPAM 1 MG PO TABS: 1.0000 mg | ORAL_TABLET | Freq: Once | ORAL | 0 refills | Status: DC | PRN
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
## 2021-07-02 ENCOUNTER — Other Ambulatory Visit: Payer: Self-pay | Admitting: Internal Medicine

## 2021-07-03 NOTE — Telephone Encounter (Signed)
Patient is on Lamictal increase and this refill is not appropriate.

## 2021-07-05 ENCOUNTER — Other Ambulatory Visit: Payer: Self-pay | Admitting: Physician Assistant

## 2021-07-05 DIAGNOSIS — R102 Pelvic and perineal pain: Secondary | ICD-10-CM

## 2021-07-06 ENCOUNTER — Ambulatory Visit
Admission: RE | Admit: 2021-07-06 | Discharge: 2021-07-06 | Disposition: A | Discharge status: Home / Self Care | Payer: BC Managed Care – PPO | Source: Ambulatory Visit | Attending: Physician Assistant | Admitting: Physician Assistant

## 2021-07-06 ENCOUNTER — Other Ambulatory Visit: Payer: Self-pay

## 2021-07-06 DIAGNOSIS — R102 Pelvic and perineal pain: Secondary | ICD-10-CM

## 2021-07-10 ENCOUNTER — Other Ambulatory Visit: Payer: Self-pay | Admitting: Internal Medicine

## 2021-07-13 ENCOUNTER — Ambulatory Visit: Payer: BC Managed Care – PPO | Admitting: Internal Medicine

## 2021-07-27 ENCOUNTER — Other Ambulatory Visit: Payer: Self-pay | Admitting: Radiation Therapy

## 2021-08-12 ENCOUNTER — Ambulatory Visit
Admission: RE | Admit: 2021-08-12 | Discharge: 2021-08-12 | Disposition: A | Discharge status: Home / Self Care | Payer: BC Managed Care – PPO | Source: Ambulatory Visit | Attending: Internal Medicine | Admitting: Internal Medicine

## 2021-08-12 ENCOUNTER — Other Ambulatory Visit: Payer: Self-pay

## 2021-08-12 DIAGNOSIS — R9089 Other abnormal findings on diagnostic imaging of central nervous system: Secondary | ICD-10-CM

## 2021-08-12 MED ORDER — GADOBENATE DIMEGLUMINE 529 MG/ML IV SOLN
14.0000 mL | Freq: Once | INTRAVENOUS | Status: AC | PRN
  Administered 2021-08-12: 14 mL via INTRAVENOUS

## 2021-08-17 ENCOUNTER — Inpatient Hospital Stay: Payer: BC Managed Care – PPO | Attending: Internal Medicine | Admitting: Internal Medicine

## 2021-08-17 ENCOUNTER — Other Ambulatory Visit: Payer: Self-pay

## 2021-08-17 VITALS — BP 133/70 | HR 83 | Temp 98.4°F | Resp 17 | Ht 67.5 in | Wt 218.6 lb

## 2021-08-17 DIAGNOSIS — G939 Disorder of brain, unspecified: Secondary | ICD-10-CM | POA: Diagnosis present

## 2021-08-17 DIAGNOSIS — R110 Nausea: Secondary | ICD-10-CM | POA: Insufficient documentation

## 2021-08-17 DIAGNOSIS — Z79899 Other long term (current) drug therapy: Secondary | ICD-10-CM | POA: Diagnosis not present

## 2021-08-17 DIAGNOSIS — R9089 Other abnormal findings on diagnostic imaging of central nervous system: Secondary | ICD-10-CM | POA: Insufficient documentation

## 2021-08-17 DIAGNOSIS — R9402 Abnormal brain scan: Secondary | ICD-10-CM | POA: Diagnosis not present

## 2021-08-17 DIAGNOSIS — R569 Unspecified convulsions: Secondary | ICD-10-CM | POA: Insufficient documentation

## 2021-08-17 DIAGNOSIS — R410 Disorientation, unspecified: Secondary | ICD-10-CM | POA: Insufficient documentation

## 2021-08-17 DIAGNOSIS — R519 Headache, unspecified: Secondary | ICD-10-CM | POA: Insufficient documentation
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
## 2021-08-18 ENCOUNTER — Telehealth: Payer: Self-pay | Admitting: Internal Medicine

## 2021-08-18 NOTE — Telephone Encounter (Signed)
Scheduled appt per 9/1 los. Pt aware.  

## 2021-09-23 ENCOUNTER — Other Ambulatory Visit: Payer: Self-pay | Admitting: Internal Medicine

## 2021-11-27 ENCOUNTER — Other Ambulatory Visit: Payer: Self-pay

## 2021-12-22 ENCOUNTER — Other Ambulatory Visit: Payer: Managed Care, Other (non HMO)

## 2021-12-26 ENCOUNTER — Ambulatory Visit: Payer: Managed Care, Other (non HMO) | Admitting: Internal Medicine

## 2022-01-01 ENCOUNTER — Other Ambulatory Visit: Payer: Self-pay | Admitting: Radiation Therapy

## 2022-01-03 ENCOUNTER — Other Ambulatory Visit: Payer: Self-pay

## 2022-01-03 ENCOUNTER — Ambulatory Visit
Admission: RE | Admit: 2022-01-03 | Discharge: 2022-01-03 | Disposition: A | Discharge status: Home / Self Care | Payer: Managed Care, Other (non HMO) | Source: Ambulatory Visit | Attending: Internal Medicine | Admitting: Internal Medicine

## 2022-01-03 DIAGNOSIS — R9089 Other abnormal findings on diagnostic imaging of central nervous system: Secondary | ICD-10-CM

## 2022-01-03 MED ORDER — GADOBENATE DIMEGLUMINE 529 MG/ML IV SOLN
20.0000 mL | Freq: Once | INTRAVENOUS | Status: AC | PRN
  Administered 2022-01-03: 20 mL via INTRAVENOUS

## 2022-01-11 ENCOUNTER — Inpatient Hospital Stay: Payer: Managed Care, Other (non HMO) | Attending: Internal Medicine | Admitting: Internal Medicine

## 2022-01-11 ENCOUNTER — Other Ambulatory Visit: Payer: Self-pay

## 2022-01-11 VITALS — BP 133/84 | HR 75 | Temp 97.6°F | Resp 16 | Ht 67.5 in | Wt 225.7 lb

## 2022-01-11 DIAGNOSIS — I10 Essential (primary) hypertension: Secondary | ICD-10-CM | POA: Diagnosis not present

## 2022-01-11 DIAGNOSIS — Z79899 Other long term (current) drug therapy: Secondary | ICD-10-CM | POA: Insufficient documentation

## 2022-01-11 DIAGNOSIS — R930 Abnormal findings on diagnostic imaging of skull and head, not elsewhere classified: Secondary | ICD-10-CM | POA: Insufficient documentation

## 2022-01-11 DIAGNOSIS — R569 Unspecified convulsions: Secondary | ICD-10-CM | POA: Diagnosis not present

## 2022-01-11 DIAGNOSIS — R9089 Other abnormal findings on diagnostic imaging of central nervous system: Secondary | ICD-10-CM | POA: Diagnosis not present

## 2022-01-11 MED ORDER — LAMOTRIGINE 100 MG PO TABS: 100.0000 mg | ORAL_TABLET | Freq: Two times a day (BID) | ORAL | 3 refills | Status: DC

## 2022-01-11 NOTE — Addendum Note (Signed)
Addended by: Ventura Sellers on: 01/11/2022 11:45 AM   Modules accepted: Orders
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
## 2022-01-15 ENCOUNTER — Encounter: Payer: Self-pay | Admitting: General Practice

## 2022-01-15 NOTE — Progress Notes (Signed)
[facility] Spiritual Care Note  Referred by Chaplain Karsten Ro Lomax/[facility] at the [facility]. Left voicemail introducing Spiritual Care as part of Patient and Family Support, encouraging return call.   [place], [place], [facility] Pager [phone] Voicemail [phone]

## 2022-01-24 ENCOUNTER — Other Ambulatory Visit: Payer: Self-pay | Admitting: Neurological Surgery

## 2022-01-24 ENCOUNTER — Other Ambulatory Visit: Payer: Self-pay | Admitting: Radiation Therapy

## 2022-02-27 ENCOUNTER — Telehealth: Payer: Self-pay | Admitting: *Deleted

## 2022-02-27 ENCOUNTER — Other Ambulatory Visit: Payer: Self-pay | Admitting: Internal Medicine

## 2022-02-27 MED ORDER — ONDANSETRON HCL 8 MG PO TABS: 8.0000 mg | ORAL_TABLET | Freq: Three times a day (TID) | ORAL | 1 refills | Status: DC | PRN

## 2022-02-27 NOTE — Telephone Encounter (Signed)
Patient called about increased headaches.  She reported those at her last visit on 01/11/2022.  She states that she can suffer through those but now she has nausea and that part is too challenging.  She is calling asking for something for nausea to be prescribed. ? ? ?Routed to provider to advise how to proceed. ? ?

## 2022-03-05 ENCOUNTER — Other Ambulatory Visit: Payer: Self-pay | Admitting: *Deleted

## 2022-03-05 MED ORDER — ONDANSETRON HCL 8 MG PO TABS: 8.0000 mg | ORAL_TABLET | Freq: Three times a day (TID) | ORAL | 1 refills | Status: DC | PRN

## 2022-03-12 NOTE — Pre-Procedure Instructions (Signed)
Surgical Instructions ? ? ? Your procedure is scheduled on Monday, April 10th. ? Report to [facility] Main Entrance "A" at 12:00 P.M., then check in with the Admitting office. ? Call this number if you have problems the morning of surgery: ? [phone] ? ? If you have any questions prior to your surgery date call [phone]: Open Monday-Friday 8am-4pm ? ? ? Remember: ? Do not eat after midnight the night before your surgery ? ?You may drink clear liquids until 11:00 AM the morning of your surgery.   ?Clear liquids allowed are: Water, Non-Citrus Juices (without pulp), Carbonated Beverages, Clear Tea, Black Coffee Only (NO MILK, CREAM OR POWDERED CREAMER of any kind), and Gatorade. ?  ? Take these medicines the morning of surgery with A SIP OF WATER  ?famotidine (PEPCID) ?lamoTRIgine (LAMICTAL) ?Metaxalone [facility]) ?pantoprazole (PROTONIX)  ?ondansetron [facility])- if needed ? ? ?As of today, STOP taking any Aspirin (unless otherwise instructed by your surgeon) Aleve, Naproxen, Ibuprofen, Motrin, Advil, Goody's, BC's, all herbal medications, fish oil, and all vitamins. ?         ?           ?Do NOT Smoke (Tobacco/Vaping) for 24 hours prior to your procedure. ? ?If you use a CPAP at night, you may bring your mask/headgear for your overnight stay. ?  ?Contacts, glasses, piercing's, hearing aid's, dentures or partials may not be worn into surgery, please bring cases for these belongings.  ?  ?For patients admitted to the hospital, discharge time will be determined by your treatment team. ?  ?Patients discharged the day of surgery will not be allowed to drive home, and someone needs to stay with them for 24 hours. ? ?SURGICAL WAITING ROOM VISITATION ?Patients having surgery or a procedure may have two support people in the waiting room. These visitors may be switched out with other visitors if needed. ?Children under the age of 55 must have an adult accompany them who is not the patient. ?If the patient needs to stay  at the hospital during part of their recovery, the visitor guidelines for inpatient rooms apply. ? ?Please refer to the [facility] website for the visitor guidelines for Inpatients (after your surgery is over and you are in a regular room).  ? ? ?Special instructions:   ?Gabriela Herrera- Preparing For Surgery ? ?Before surgery, you can play an important role. Because skin is not sterile, your skin needs to be as free of germs as possible. You can reduce the number of germs on your skin by washing with CHG (chlorahexidine gluconate) Soap before surgery.  CHG is an antiseptic cleaner which kills germs and bonds with the skin to continue killing germs even after washing.   ? ?Oral Hygiene is also important to reduce your risk of infection.  Remember - BRUSH YOUR TEETH THE MORNING OF SURGERY WITH YOUR REGULAR TOOTHPASTE ? ?Please do not use if you have an allergy to CHG or antibacterial soaps. If your skin becomes reddened/irritated stop using the CHG.  ?Do not shave (including legs and underarms) for at least 48 hours prior to first CHG shower. It is OK to shave your face. ? ?Please follow these instructions carefully. ?  ?Shower the NIGHT BEFORE SURGERY and the MORNING OF SURGERY ? ?If you chose to wash your hair, wash your hair first as usual with your normal shampoo. ? ?After you shampoo, rinse your hair and body thoroughly to remove the shampoo. ? ?Use CHG Soap as you would any other  liquid soap. You can apply CHG directly to the skin and wash gently with a scrungie or a clean washcloth.  ? ?Apply the CHG Soap to your body ONLY FROM THE NECK DOWN.  Do not use on open wounds or open sores. Avoid contact with your eyes, ears, mouth and genitals (private parts). Wash Face and genitals (private parts)  with your normal soap.  ? ?Wash thoroughly, paying special attention to the area where your surgery will be performed. ? ?Thoroughly rinse your body with warm water from the neck down. ? ?DO NOT shower/wash with your normal  soap after using and rinsing off the CHG Soap. ? ?Pat yourself dry with a CLEAN TOWEL. ? ?Wear CLEAN PAJAMAS to bed the night before surgery ? ?Place CLEAN SHEETS on your bed the night before your surgery ? ?DO NOT SLEEP WITH PETS. ? ? ?Day of Surgery: ?Take a shower with CHG soap. ?Do not wear jewelry or makeup ?Do not wear lotions, powders, perfumes, or deodorant. ?Do not shave 48 hours prior to surgery.  M ?Do not bring valuables to the hospital.  ?[facility] is not responsible for any belongings or valuables. ?Do not wear nail polish, gel polish, artificial nails, or any other type of covering on natural nails (fingers and toes) ?If you have artificial nails or gel coating that need to be removed by a nail salon, please have this removed prior to surgery. Artificial nails or gel coating may interfere with anesthesia's ability to adequately monitor your vital signs. ?Wear Clean/Comfortable clothing the morning of surgery ?Do not apply any deodorants/lotions.   ?Remember to brush your teeth WITH YOUR REGULAR TOOTHPASTE. ?  ?Please read over the following fact sheets that you were given. ? ? ? ?If you received a COVID test during your pre-op visit  it is requested that you wear a mask when out in public, stay away from anyone that may not be feeling well and notify your surgeon if you develop symptoms. If you have been in contact with anyone that has tested positive in the last 10 days please notify you surgeon.  ?

## 2022-03-13 ENCOUNTER — Encounter (HOSPITAL_COMMUNITY)
Admission: RE | Admit: 2022-03-13 | Discharge: 2022-03-13 | Disposition: A | Discharge status: Home / Self Care | Payer: Managed Care, Other (non HMO) | Source: Ambulatory Visit | Attending: Neurological Surgery | Admitting: Neurological Surgery

## 2022-03-13 ENCOUNTER — Other Ambulatory Visit: Payer: Self-pay

## 2022-03-13 ENCOUNTER — Encounter (HOSPITAL_COMMUNITY): Payer: Self-pay

## 2022-03-13 VITALS — BP 124/83 | HR 86 | Temp 98.3°F | Resp 18 | Ht 67.0 in | Wt 222.6 lb

## 2022-03-13 DIAGNOSIS — Z01818 Encounter for other preprocedural examination: Secondary | ICD-10-CM | POA: Diagnosis not present

## 2022-03-13 DIAGNOSIS — F909 Attention-deficit hyperactivity disorder, unspecified type: Secondary | ICD-10-CM | POA: Insufficient documentation

## 2022-03-13 DIAGNOSIS — I10 Essential (primary) hypertension: Secondary | ICD-10-CM | POA: Diagnosis not present

## 2022-03-13 DIAGNOSIS — E669 Obesity, unspecified: Secondary | ICD-10-CM | POA: Insufficient documentation

## 2022-03-13 DIAGNOSIS — Z6834 Body mass index (BMI) 34.0-34.9, adult: Secondary | ICD-10-CM | POA: Insufficient documentation

## 2022-03-13 DIAGNOSIS — I493 Ventricular premature depolarization: Secondary | ICD-10-CM | POA: Diagnosis not present

## 2022-03-13 HISTORY — DX: Attention-deficit hyperactivity disorder, unspecified type: F90.9

## 2022-03-13 LAB — CBC
HCT: 39.3 % (ref 36.0–46.0)
Hemoglobin: 12.6 g/dL (ref 12.0–15.0)
MCH: 29.2 pg (ref 26.0–34.0)
MCHC: 32.1 g/dL (ref 30.0–36.0)
MCV: 91 fL (ref 80.0–100.0)
Platelets: 289 10*3/uL (ref 150–400)
RBC: 4.32 MIL/uL (ref 3.87–5.11)
RDW: 12.5 % (ref 11.5–15.5)
WBC: 8 10*3/uL (ref 4.0–10.5)
nRBC: 0 % (ref 0.0–0.2)

## 2022-03-13 LAB — TYPE AND SCREEN
ABO/RH(D): O POS
Antibody Screen: NEGATIVE

## 2022-03-13 LAB — BASIC METABOLIC PANEL
Anion gap: 7 (ref 5–15)
BUN: 9 mg/dL (ref 6–20)
CO2: 27 mmol/L (ref 22–32)
Calcium: 9.3 mg/dL (ref 8.9–10.3)
Chloride: 103 mmol/L (ref 98–111)
Creatinine, Ser: 0.84 mg/dL (ref 0.44–1.00)
GFR, Estimated: >60 mL/min (ref >60)
Glucose, Bld: 107 mg/dL — ABNORMAL HIGH (ref 70–99)
Potassium: 3.8 mmol/L (ref 3.5–5.1)
Sodium: 137 mmol/L (ref 135–145)

## 2022-03-13 NOTE — Progress Notes (Signed)
PCP - Geryl Councilman, PA ?Cardiologist - denies (Pt saw Dr. Tollie Eth in 2019 for cardiac workup she says due to PVCs- records requested) ? ?PPM/ICD - denies ? ? ?Chest x-ray - 09/06/19 ?EKG - 03/13/22 ?Stress Test - 2019 per pt, normal, records requested from Dr. Tollie Eth ?ECHO - 2019 per pt, normal ?Cardiac Cath - denies ? ?Sleep Study - denies ? ?DM- denies ? ?Blood Thinner Instructions: n/a ?Aspirin Instructions: n/a ? ?ERAS Protcol - yes, no drink ? ? ?COVID TEST- n/a ? ? ?Anesthesia review: yes, records requested from Dr. Wynonia Lawman ? ?Patient denies shortness of breath, fever, cough and chest pain at PAT appointment ? ? ?All instructions explained to the patient, with a verbal understanding of the material. Patient agrees to go over the instructions while at home for a better understanding. Patient also instructed to notify surgeon's office if contact with COVID+ person or symptoms develop. The opportunity to ask questions was provided. ?  ?
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
## 2022-03-14 NOTE — Anesthesia Preprocedure Evaluation (Addendum)
Anesthesia Evaluation  ?Patient identified by MRN, date of birth, ID band ?Patient awake ? ? ? ?Reviewed: ?Allergy & Precautions, H&P , NPO status , Patient's Chart, lab work & pertinent test results ? ?Airway ?Mallampati: III ? ?TM Distance: >3 FB ?Neck ROM: Full ? ? ? Dental ?no notable dental hx. ?(+) Teeth Intact, Dental Advisory Given ?  ?Pulmonary ?neg pulmonary ROS,  ?  ?Pulmonary exam normal ?breath sounds clear to auscultation ? ? ? ? ? ? Cardiovascular ?hypertension, Pt. on medications ? ?Rhythm:Regular Rate:Normal ? ? ?  ?Neuro/Psych ?Seizures -,  negative psych ROS  ? GI/Hepatic ?negative GI ROS, Neg liver ROS,   ?Endo/Other  ?negative endocrine ROS ? Renal/GU ?negative Renal ROS  ?negative genitourinary ?  ?Musculoskeletal ? ? Abdominal ?  ?Peds ? Hematology ?negative hematology ROS ?(+)   ?Anesthesia Other Findings ? ? Reproductive/Obstetrics ?negative OB ROS ? ?  ? ? ? ? ? ? ? ? ? ? ? ? ? ?  ?  ? ? ? ? ? ? ?Anesthesia Physical ?Anesthesia Plan ? ?ASA: 3 ? ?Anesthesia Plan: General  ? ?Post-op Pain Management: Tylenol PO (pre-op)*  ? ?Induction: Intravenous ? ?PONV Risk Score and Plan: 4 or greater and Ondansetron, Dexamethasone and Midazolam ? ?Airway Management Planned: Oral ETT ? ?Additional Equipment: Arterial line ? ?Intra-op Plan:  ? ?Post-operative Plan: Extubation in OR ? ?Informed Consent: I have reviewed the patients History and Physical, chart, labs and discussed the procedure including the risks, benefits and alternatives for the proposed anesthesia with the patient or authorized representative who has indicated his/her understanding and acceptance.  ? ? ? ?Dental advisory given ? ?Plan Discussed with: CRNA ? ?Anesthesia Plan Comments: (PAT note written 03/15/2022 by Myra Gianotti, PA-C.)  ? ? ? ? ?Anesthesia Quick Evaluation ? ?

## 2022-03-23 NOTE — Progress Notes (Signed)
Patient voiced understanding of new arrival time of 0800. voiced understanding of eras until 0700. ?

## 2022-03-26 ENCOUNTER — Inpatient Hospital Stay (HOSPITAL_COMMUNITY): Payer: Commercial Managed Care - HMO | Admitting: Physician Assistant

## 2022-03-26 ENCOUNTER — Inpatient Hospital Stay (HOSPITAL_COMMUNITY)
Admission: RE | Admit: 2022-03-26 | Discharge: 2022-03-27 | DRG: 027 | Disposition: A | Discharge status: Home / Self Care | Payer: Commercial Managed Care - HMO | Attending: Neurological Surgery | Admitting: Neurological Surgery

## 2022-03-26 ENCOUNTER — Inpatient Hospital Stay (HOSPITAL_COMMUNITY): Payer: Commercial Managed Care - HMO | Admitting: Anesthesiology

## 2022-03-26 ENCOUNTER — Encounter (HOSPITAL_COMMUNITY): Payer: Self-pay | Admitting: Neurological Surgery

## 2022-03-26 ENCOUNTER — Encounter (HOSPITAL_COMMUNITY)
Admission: RE | Disposition: A | Discharge status: Home / Self Care | Payer: Self-pay | Source: Home / Self Care | Attending: Neurological Surgery

## 2022-03-26 DIAGNOSIS — C719 Malignant neoplasm of brain, unspecified: Secondary | ICD-10-CM | POA: Diagnosis present

## 2022-03-26 DIAGNOSIS — D496 Neoplasm of unspecified behavior of brain: Principal | ICD-10-CM

## 2022-03-26 DIAGNOSIS — R569 Unspecified convulsions: Secondary | ICD-10-CM | POA: Diagnosis not present

## 2022-03-26 DIAGNOSIS — G9612 Meningeal adhesions (cerebral) (spinal): Secondary | ICD-10-CM | POA: Diagnosis present

## 2022-03-26 DIAGNOSIS — Z79899 Other long term (current) drug therapy: Secondary | ICD-10-CM

## 2022-03-26 DIAGNOSIS — I10 Essential (primary) hypertension: Secondary | ICD-10-CM

## 2022-03-26 DIAGNOSIS — C711 Malignant neoplasm of frontal lobe: Secondary | ICD-10-CM | POA: Diagnosis present

## 2022-03-26 HISTORY — PX: CRANIOTOMY: SHX93

## 2022-03-26 HISTORY — PX: APPLICATION OF CRANIAL NAVIGATION: SHX6578

## 2022-03-26 LAB — CREATININE, SERUM
Creatinine, Ser: 0.9 mg/dL (ref 0.44–1.00)
GFR, Estimated: >60 mL/min (ref >60)

## 2022-03-26 LAB — CBC
HCT: 36.1 % (ref 36.0–46.0)
Hemoglobin: 12.1 g/dL (ref 12.0–15.0)
MCH: 29.7 pg (ref 26.0–34.0)
MCHC: 33.5 g/dL (ref 30.0–36.0)
MCV: 88.5 fL (ref 80.0–100.0)
Platelets: 230 10*3/uL (ref 150–400)
RBC: 4.08 MIL/uL (ref 3.87–5.11)
RDW: 12.7 % (ref 11.5–15.5)
WBC: 13 10*3/uL — ABNORMAL HIGH (ref 4.0–10.5)
nRBC: 0 % (ref 0.0–0.2)

## 2022-03-26 LAB — MRSA NEXT GEN BY PCR, NASAL: MRSA by PCR Next Gen: NOT DETECTED

## 2022-03-26 LAB — ABO/RH: ABO/RH(D): O POS

## 2022-03-26 SURGERY — CRANIOTOMY TUMOR EXCISION
Anesthesia: General | Site: Head

## 2022-03-26 MED ORDER — DOCUSATE SODIUM 100 MG PO CAPS
100.0000 mg | ORAL_CAPSULE | Freq: Two times a day (BID) | ORAL | Status: DC
  Administered 2022-03-27: 100 mg via ORAL
  Filled 2022-03-26: qty 1

## 2022-03-26 MED ORDER — HYDROCODONE-ACETAMINOPHEN 5-325 MG PO TABS
1.0000 | ORAL_TABLET | ORAL | Status: DC | PRN
  Administered 2022-03-27: 1 via ORAL
  Filled 2022-03-26: qty 1

## 2022-03-26 MED ORDER — THROMBIN 5000 UNITS EX SOLR
CUTANEOUS | Status: AC
  Filled 2022-03-26: qty 5000

## 2022-03-26 MED ORDER — TRAMADOL HCL 50 MG PO TABS
100.0000 mg | ORAL_TABLET | Freq: Three times a day (TID) | ORAL | Status: DC | PRN
  Administered 2022-03-26 – 2022-03-27 (×3): 100 mg via ORAL
  Filled 2022-03-26 (×3): qty 2

## 2022-03-26 MED ORDER — PROPOFOL 10 MG/ML IV BOLUS
INTRAVENOUS | Status: AC
  Filled 2022-03-26: qty 20

## 2022-03-26 MED ORDER — THROMBIN 20000 UNITS EX SOLR
CUTANEOUS | Status: AC
  Filled 2022-03-26: qty 20000

## 2022-03-26 MED ORDER — MIDAZOLAM HCL 2 MG/2ML IJ SOLN
INTRAMUSCULAR | Status: AC
  Filled 2022-03-26: qty 2

## 2022-03-26 MED ORDER — ONDANSETRON HCL 4 MG/2ML IJ SOLN
4.0000 mg | Freq: Four times a day (QID) | INTRAMUSCULAR | Status: DC | PRN
Start: 2022-03-26 — End: 2022-03-26

## 2022-03-26 MED ORDER — PHENYLEPHRINE HCL-NACL 20-0.9 MG/250ML-% IV SOLN
INTRAVENOUS | Status: AC
  Filled 2022-03-26: qty 250

## 2022-03-26 MED ORDER — FENTANYL CITRATE (PF) 250 MCG/5ML IJ SOLN
INTRAMUSCULAR | Status: AC
Start: 2022-03-26 — End: ?
  Filled 2022-03-26: qty 5

## 2022-03-26 MED ORDER — DIAZEPAM 5 MG PO TABS
10.0000 mg | ORAL_TABLET | Freq: Once | ORAL | Status: AC | PRN
  Administered 2022-03-27: 10 mg via ORAL
  Filled 2022-03-26: qty 2

## 2022-03-26 MED ORDER — POLYETHYLENE GLYCOL 3350 17 G PO PACK: 17.0000 g | PACK | Freq: Every day | ORAL | Status: DC | PRN

## 2022-03-26 MED ORDER — ONDANSETRON HCL 4 MG/2ML IJ SOLN
4.0000 mg | INTRAMUSCULAR | Status: DC | PRN
  Administered 2022-03-26: 4 mg via INTRAVENOUS
  Filled 2022-03-26: qty 2

## 2022-03-26 MED ORDER — DEXAMETHASONE SODIUM PHOSPHATE 10 MG/ML IJ SOLN
INTRAMUSCULAR | Status: AC
  Filled 2022-03-26: qty 1

## 2022-03-26 MED ORDER — CHLORHEXIDINE GLUCONATE CLOTH 2 % EX PADS: 6.0000 | MEDICATED_PAD | Freq: Once | CUTANEOUS | Status: DC

## 2022-03-26 MED ORDER — FENTANYL CITRATE (PF) 100 MCG/2ML IJ SOLN: 25.0000 ug | INTRAMUSCULAR | Status: DC | PRN

## 2022-03-26 MED ORDER — LIDOCAINE-EPINEPHRINE 1 %-1:100000 IJ SOLN
INTRAMUSCULAR | Status: AC
  Filled 2022-03-26: qty 1

## 2022-03-26 MED ORDER — GELATIN ABSORBABLE MT POWD: OROMUCOSAL | Status: DC | PRN

## 2022-03-26 MED ORDER — CEFAZOLIN SODIUM-DEXTROSE 2-4 GM/100ML-% IV SOLN
2.0000 g | Freq: Three times a day (TID) | INTRAVENOUS | Status: AC
  Administered 2022-03-26 (×2): 2 g via INTRAVENOUS
  Filled 2022-03-26 (×2): qty 100

## 2022-03-26 MED ORDER — ROCURONIUM BROMIDE 10 MG/ML (PF) SYRINGE
PREFILLED_SYRINGE | INTRAVENOUS | Status: DC | PRN
  Administered 2022-03-26: 20 mg via INTRAVENOUS
  Administered 2022-03-26: 30 mg via INTRAVENOUS
  Administered 2022-03-26: 70 mg via INTRAVENOUS
  Administered 2022-03-26: 30 mg via INTRAVENOUS

## 2022-03-26 MED ORDER — ACETAMINOPHEN 500 MG PO TABS
1000.0000 mg | ORAL_TABLET | Freq: Once | ORAL | Status: AC
  Administered 2022-03-26: 1000 mg via ORAL
  Filled 2022-03-26: qty 2

## 2022-03-26 MED ORDER — HYDROCHLOROTHIAZIDE 12.5 MG PO TABS
12.5000 mg | ORAL_TABLET | Freq: Every day | ORAL | Status: DC
  Filled 2022-03-26 (×2): qty 1

## 2022-03-26 MED ORDER — LABETALOL HCL 5 MG/ML IV SOLN: 10.0000 mg | INTRAVENOUS | Status: DC | PRN

## 2022-03-26 MED ORDER — FENTANYL CITRATE (PF) 250 MCG/5ML IJ SOLN
INTRAMUSCULAR | Status: AC
  Filled 2022-03-26: qty 5

## 2022-03-26 MED ORDER — LISINOPRIL-HYDROCHLOROTHIAZIDE 10-12.5 MG PO TABS
1.0000 | ORAL_TABLET | Freq: Every day | ORAL | Status: DC
Start: 2022-03-26 — End: 2022-03-26

## 2022-03-26 MED ORDER — GELATIN ABSORBABLE 100 EX MISC: CUTANEOUS | Status: DC | PRN

## 2022-03-26 MED ORDER — FENTANYL CITRATE PF 50 MCG/ML IJ SOSY
50.0000 ug | PREFILLED_SYRINGE | INTRAMUSCULAR | Status: DC | PRN
  Administered 2022-03-26 (×3): 50 ug via INTRAVENOUS
  Filled 2022-03-26 (×3): qty 1

## 2022-03-26 MED ORDER — ONDANSETRON HCL 4 MG/2ML IJ SOLN
INTRAMUSCULAR | Status: AC
  Filled 2022-03-26: qty 2

## 2022-03-26 MED ORDER — FENTANYL CITRATE (PF) 250 MCG/5ML IJ SOLN
INTRAMUSCULAR | Status: DC | PRN
  Administered 2022-03-26: 100 ug via INTRAVENOUS
  Administered 2022-03-26 (×3): 50 ug via INTRAVENOUS

## 2022-03-26 MED ORDER — HEPARIN SODIUM (PORCINE) 5000 UNIT/ML IJ SOLN: 5000.0000 [IU] | Freq: Three times a day (TID) | INTRAMUSCULAR | Status: DC

## 2022-03-26 MED ORDER — DEXMEDETOMIDINE (PRECEDEX) IN NS 20 MCG/5ML (4 MCG/ML) IV SYRINGE
PREFILLED_SYRINGE | INTRAVENOUS | Status: DC | PRN
  Administered 2022-03-26: 8 ug via INTRAVENOUS
  Administered 2022-03-26: 4 ug via INTRAVENOUS

## 2022-03-26 MED ORDER — SUCCINYLCHOLINE CHLORIDE 200 MG/10ML IV SOSY
PREFILLED_SYRINGE | INTRAVENOUS | Status: AC
  Filled 2022-03-26: qty 10

## 2022-03-26 MED ORDER — PANTOPRAZOLE SODIUM 40 MG PO TBEC
40.0000 mg | DELAYED_RELEASE_TABLET | Freq: Every day | ORAL | Status: DC
  Administered 2022-03-26 – 2022-03-27 (×2): 40 mg via ORAL
  Filled 2022-03-26 (×2): qty 1

## 2022-03-26 MED ORDER — ONDANSETRON HCL 4 MG PO TABS
4.0000 mg | ORAL_TABLET | ORAL | Status: DC | PRN
  Administered 2022-03-26: 4 mg via ORAL
  Filled 2022-03-26: qty 1

## 2022-03-26 MED ORDER — FAMOTIDINE 20 MG PO TABS
20.0000 mg | ORAL_TABLET | Freq: Every day | ORAL | Status: DC
  Administered 2022-03-26 – 2022-03-27 (×2): 20 mg via ORAL
  Filled 2022-03-26 (×2): qty 1

## 2022-03-26 MED ORDER — 0.9 % SODIUM CHLORIDE (POUR BTL) OPTIME
TOPICAL | Status: DC | PRN
  Administered 2022-03-26 (×3): 1000 mL

## 2022-03-26 MED ORDER — LISINOPRIL 10 MG PO TABS
10.0000 mg | ORAL_TABLET | Freq: Every day | ORAL | Status: DC
  Filled 2022-03-26: qty 1

## 2022-03-26 MED ORDER — SODIUM CHLORIDE 0.9 % IV SOLN: INTRAVENOUS | Status: DC | PRN

## 2022-03-26 MED ORDER — BACITRACIN ZINC 500 UNIT/GM EX OINT
TOPICAL_OINTMENT | CUTANEOUS | Status: DC | PRN
  Administered 2022-03-26: 1 via TOPICAL

## 2022-03-26 MED ORDER — ONDANSETRON HCL 4 MG/2ML IJ SOLN
INTRAMUSCULAR | Status: DC | PRN
  Administered 2022-03-26: 4 mg via INTRAVENOUS

## 2022-03-26 MED ORDER — METAXALONE 800 MG PO TABS
800.0000 mg | ORAL_TABLET | Freq: Every day | ORAL | Status: DC | PRN
  Filled 2022-03-26: qty 1

## 2022-03-26 MED ORDER — ACETAMINOPHEN 325 MG PO TABS
650.0000 mg | ORAL_TABLET | ORAL | Status: DC | PRN
  Administered 2022-03-26 (×2): 650 mg via ORAL
  Filled 2022-03-26 (×2): qty 2

## 2022-03-26 MED ORDER — LAMOTRIGINE 100 MG PO TABS
100.0000 mg | ORAL_TABLET | Freq: Two times a day (BID) | ORAL | Status: DC
Start: 2022-03-26 — End: 2022-03-27
  Administered 2022-03-26 – 2022-03-27 (×2): 100 mg via ORAL
  Filled 2022-03-26 (×2): qty 1

## 2022-03-26 MED ORDER — ADULT MULTIVITAMIN W/MINERALS CH
2.0000 | ORAL_TABLET | Freq: Every day | ORAL | Status: DC
  Administered 2022-03-26 – 2022-03-27 (×2): 2 via ORAL
  Filled 2022-03-26 (×2): qty 2

## 2022-03-26 MED ORDER — DEXAMETHASONE SODIUM PHOSPHATE 10 MG/ML IJ SOLN
INTRAMUSCULAR | Status: DC | PRN
  Administered 2022-03-26: 10 mg via INTRAVENOUS

## 2022-03-26 MED ORDER — MORPHINE SULFATE (PF) 4 MG/ML IV SOLN: 4.0000 mg | INTRAVENOUS | Status: DC | PRN

## 2022-03-26 MED ORDER — PROPOFOL 10 MG/ML IV BOLUS
INTRAVENOUS | Status: DC | PRN
  Administered 2022-03-26: 50 mg via INTRAVENOUS
  Administered 2022-03-26: 150 mg via INTRAVENOUS
  Administered 2022-03-26: 50 mg via INTRAVENOUS

## 2022-03-26 MED ORDER — SUGAMMADEX SODIUM 200 MG/2ML IV SOLN
INTRAVENOUS | Status: DC | PRN
  Administered 2022-03-26: 200 mg via INTRAVENOUS

## 2022-03-26 MED ORDER — CEFAZOLIN SODIUM-DEXTROSE 2-4 GM/100ML-% IV SOLN
2.0000 g | INTRAVENOUS | Status: AC
  Administered 2022-03-26: 2 g via INTRAVENOUS
  Filled 2022-03-26: qty 100

## 2022-03-26 MED ORDER — CHLORHEXIDINE GLUCONATE CLOTH 2 % EX PADS
6.0000 | MEDICATED_PAD | Freq: Every day | CUTANEOUS | Status: DC
  Administered 2022-03-26 – 2022-03-27 (×2): 6 via TOPICAL

## 2022-03-26 MED ORDER — PHENYLEPHRINE HCL-NACL 20-0.9 MG/250ML-% IV SOLN
INTRAVENOUS | Status: DC | PRN
  Administered 2022-03-26: 25 ug/min via INTRAVENOUS

## 2022-03-26 MED ORDER — LIDOCAINE 2% (20 MG/ML) 5 ML SYRINGE
INTRAMUSCULAR | Status: DC | PRN
  Administered 2022-03-26: 60 mg via INTRAVENOUS

## 2022-03-26 MED ORDER — NITROGLYCERIN IN D5W 200-5 MCG/ML-% IV SOLN
INTRAVENOUS | Status: AC
  Filled 2022-03-26: qty 250

## 2022-03-26 MED ORDER — ACETAMINOPHEN 650 MG RE SUPP: 650.0000 mg | RECTAL | Status: DC | PRN

## 2022-03-26 MED ORDER — LIDOCAINE 2% (20 MG/ML) 5 ML SYRINGE
INTRAMUSCULAR | Status: AC
  Filled 2022-03-26: qty 5

## 2022-03-26 MED ORDER — CHLORHEXIDINE GLUCONATE 0.12 % MT SOLN
15.0000 mL | Freq: Once | OROMUCOSAL | Status: AC
  Administered 2022-03-26: 15 mL via OROMUCOSAL
  Filled 2022-03-26: qty 15

## 2022-03-26 MED ORDER — LACTATED RINGERS IV SOLN
INTRAVENOUS | Status: DC
Start: 2022-03-26 — End: 2022-03-26

## 2022-03-26 MED ORDER — ROCURONIUM BROMIDE 10 MG/ML (PF) SYRINGE
PREFILLED_SYRINGE | INTRAVENOUS | Status: AC
  Filled 2022-03-26: qty 10

## 2022-03-26 MED ORDER — BACITRACIN ZINC 500 UNIT/GM EX OINT
TOPICAL_OINTMENT | CUTANEOUS | Status: AC
  Filled 2022-03-26: qty 28.35

## 2022-03-26 MED ORDER — ORAL CARE MOUTH RINSE: 15.0000 mL | Freq: Once | OROMUCOSAL | Status: AC

## 2022-03-26 MED ORDER — PHENYLEPHRINE 40 MCG/ML (10ML) SYRINGE FOR IV PUSH (FOR BLOOD PRESSURE SUPPORT)
PREFILLED_SYRINGE | INTRAVENOUS | Status: AC
  Filled 2022-03-26: qty 10

## 2022-03-26 SURGICAL SUPPLY — 93 items
BAG COUNTER SPONGE SURGICOUNT (BAG) ×3 IMPLANT
BAND RUBBER #18 3X1/16 STRL (MISCELLANEOUS) IMPLANT
BENZOIN TINCTURE PRP APPL 2/3 (GAUZE/BANDAGES/DRESSINGS) IMPLANT
BLADE CLIPPER SURG (BLADE) ×3 IMPLANT
BLADE SAW GIGLI 16 STRL (MISCELLANEOUS) IMPLANT
BLADE SURG 15 STRL LF DISP TIS (BLADE) IMPLANT
BLADE SURG 15 STRL SS (BLADE)
BNDG GAUZE ELAST 4 BULKY (GAUZE/BANDAGES/DRESSINGS) IMPLANT
BNDG STRETCH 4X75 STRL LF (GAUZE/BANDAGES/DRESSINGS) IMPLANT
BUR ACORN 9.0 PRECISION (BURR) ×3 IMPLANT
BUR ROUND PRECISION 4.0 (BURR) IMPLANT
BUR SPIRAL ROUTER 2.3 (BUR) ×3 IMPLANT
CANISTER SUCT 3000ML PPV (MISCELLANEOUS) ×6 IMPLANT
CATH VENTRIC 35X38 W/TROCAR LG (CATHETERS) IMPLANT
CLIP VESOCCLUDE MED 6/CT (CLIP) IMPLANT
CNTNR URN SCR LID CUP LEK RST (MISCELLANEOUS) ×2 IMPLANT
CONT SPEC 4OZ STRL OR WHT (MISCELLANEOUS) ×1
COVER BURR HOLE 7 (Orthopedic Implant) ×1 IMPLANT
COVER MAYO STAND STRL (DRAPES) IMPLANT
DECANTER SPIKE VIAL GLASS SM (MISCELLANEOUS) ×3 IMPLANT
DRAIN SUBARACHNOID (WOUND CARE) IMPLANT
DRAPE HALF SHEET 40X57 (DRAPES) ×3 IMPLANT
DRAPE MICROSCOPE LEICA (MISCELLANEOUS) IMPLANT
DRAPE NEUROLOGICAL W/INCISE (DRAPES) ×3 IMPLANT
DRAPE STERI IOBAN 125X83 (DRAPES) IMPLANT
DRAPE SURG 17X23 STRL (DRAPES) IMPLANT
DRAPE WARM FLUID 44X44 (DRAPES) ×3 IMPLANT
DRSG ADAPTIC 3X8 NADH LF (GAUZE/BANDAGES/DRESSINGS) IMPLANT
DRSG TELFA 3X8 NADH (GAUZE/BANDAGES/DRESSINGS) IMPLANT
DURAPREP 6ML APPLICATOR 50/CS (WOUND CARE) ×3 IMPLANT
ELECT REM PT RETURN 9FT ADLT (ELECTROSURGICAL) ×3
ELECTRODE REM PT RTRN 9FT ADLT (ELECTROSURGICAL) ×2 IMPLANT
EVACUATOR 1/8 PVC DRAIN (DRAIN) IMPLANT
EVACUATOR SILICONE 100CC (DRAIN) IMPLANT
FORCEPS BIPOLAR SPETZLER 8 1.0 (NEUROSURGERY SUPPLIES) ×3 IMPLANT
GAUZE 4X4 16PLY ~~LOC~~+RFID DBL (SPONGE) IMPLANT
GAUZE SPONGE 4X4 12PLY STRL (GAUZE/BANDAGES/DRESSINGS) IMPLANT
GLOVE BIOGEL PI IND STRL 6.5 (GLOVE) IMPLANT
GLOVE BIOGEL PI INDICATOR 6.5 (GLOVE) ×1
GLOVE EXAM NITRILE LRG STRL (GLOVE) IMPLANT
GLOVE EXAM NITRILE XS STR PU (GLOVE) IMPLANT
GLOVE SURG ENC MOIS LTX SZ7 (GLOVE) IMPLANT
GLOVE SURG LTX SZ7.5 (GLOVE) ×3 IMPLANT
GLOVE SURG SS PI 6.5 STRL IVOR (GLOVE) ×1 IMPLANT
GLOVE SURG UNDER POLY LF SZ7 (GLOVE) IMPLANT
GLOVE SURG UNDER POLY LF SZ7.5 (GLOVE) ×3 IMPLANT
GOWN STRL REUS W/ TWL LRG LVL3 (GOWN DISPOSABLE) ×4 IMPLANT
GOWN STRL REUS W/ TWL XL LVL3 (GOWN DISPOSABLE) IMPLANT
GOWN STRL REUS W/TWL 2XL LVL3 (GOWN DISPOSABLE) IMPLANT
GOWN STRL REUS W/TWL LRG LVL3 (GOWN DISPOSABLE) ×3
GOWN STRL REUS W/TWL XL LVL3 (GOWN DISPOSABLE)
HEMOSTAT POWDER KIT SURGIFOAM (HEMOSTASIS) ×3 IMPLANT
HEMOSTAT SURGICEL 2X14 (HEMOSTASIS) ×3 IMPLANT
IV NS 1000ML (IV SOLUTION) ×1
IV NS 1000ML BAXH (IV SOLUTION) ×2 IMPLANT
KIT BASIN OR (CUSTOM PROCEDURE TRAY) ×3 IMPLANT
KIT DRAIN CSF ACCUDRAIN (MISCELLANEOUS) IMPLANT
KIT TURNOVER KIT B (KITS) ×3 IMPLANT
MARKER SPHERE PSV REFLC 13MM (MARKER) ×8 IMPLANT
NDL SPNL 18GX3.5 QUINCKE PK (NEEDLE) IMPLANT
NEEDLE HYPO 22GX1.5 SAFETY (NEEDLE) ×3 IMPLANT
NEEDLE SPNL 18GX3.5 QUINCKE PK (NEEDLE) IMPLANT
NS IRRIG 1000ML POUR BTL (IV SOLUTION) ×9 IMPLANT
PACK CRANIOTOMY CUSTOM (CUSTOM PROCEDURE TRAY) ×3 IMPLANT
PAD DRESSING TELFA 3X8 NADH (GAUZE/BANDAGES/DRESSINGS) IMPLANT
PATTIES SURGICAL .25X.25 (GAUZE/BANDAGES/DRESSINGS) IMPLANT
PATTIES SURGICAL .5 X.5 (GAUZE/BANDAGES/DRESSINGS) IMPLANT
PATTIES SURGICAL .5 X3 (DISPOSABLE) IMPLANT
PATTIES SURGICAL 1/4 X 3 (GAUZE/BANDAGES/DRESSINGS) IMPLANT
PATTIES SURGICAL 1X1 (DISPOSABLE) IMPLANT
PIN MAYFIELD SKULL DISP (PIN) ×3 IMPLANT
PLATE DOUBLE Y CMF 6H (Plate) ×2 IMPLANT
SCREW UNIII AXS SD 1.5X4 (Screw) ×12 IMPLANT
SPECIMEN JAR SMALL (MISCELLANEOUS) IMPLANT
SPONGE NEURO XRAY DETECT 1X3 (DISPOSABLE) IMPLANT
SPONGE SURGIFOAM ABS GEL 100 (HEMOSTASIS) ×3 IMPLANT
STAPLER VISISTAT 35W (STAPLE) ×4 IMPLANT
SUT ETHILON 3 0 FSL (SUTURE) IMPLANT
SUT ETHILON 3 0 PS 1 (SUTURE) IMPLANT
SUT MNCRL AB 3-0 PS2 18 (SUTURE) IMPLANT
SUT MON AB 3-0 SH 27 (SUTURE)
SUT MON AB 3-0 SH27 (SUTURE) IMPLANT
SUT NURALON 4 0 TR CR/8 (SUTURE) ×9 IMPLANT
SUT SILK 0 TIES 10X30 (SUTURE) IMPLANT
SUT VIC AB 2-0 CP2 18 (SUTURE) ×3 IMPLANT
TIP NONSTICK .5MMX23CM (INSTRUMENTS) ×1
TIP NONSTICK .5X23 (INSTRUMENTS) IMPLANT
TOWEL GREEN STERILE (TOWEL DISPOSABLE) ×3 IMPLANT
TOWEL GREEN STERILE FF (TOWEL DISPOSABLE) ×3 IMPLANT
TRAY FOLEY MTR SLVR 16FR STAT (SET/KITS/TRAYS/PACK) ×3 IMPLANT
TUBE CONNECTING 12X1/4 (SUCTIONS) ×3 IMPLANT
UNDERPAD 30X36 HEAVY ABSORB (UNDERPADS AND DIAPERS) ×3 IMPLANT
WATER STERILE IRR 1000ML POUR (IV SOLUTION) ×3 IMPLANT

## 2022-03-26 NOTE — Transfer of Care (Signed)
Immediate Anesthesia Transfer of Care Note ? ?Patient: Gabriela Herrera ? ?Procedure(s) Performed: Left craniotomy for tumor resection with brainlab (Left: Head) ?APPLICATION OF CRANIAL NAVIGATION (Head) ? ?Patient Location: PACU ? ?Anesthesia Type:General ? ?Level of Consciousness: drowsy, patient cooperative and responds to stimulation ? ?Airway & Oxygen Therapy: Patient Spontanous Breathing and Patient connected to face mask oxygen ? ?Post-op Assessment: Report given to RN, Post -op Vital signs reviewed and stable and Patient moving all extremities X 4 ? ?Post vital signs: Reviewed and stable ? ?Last Vitals:  ?Vitals Value Taken Time  ?BP 131/71 03/26/22 1407  ?Temp    ?Pulse 86 03/26/22 1410  ?Resp 17 03/26/22 1410  ?SpO2 97 % 03/26/22 1410  ?Vitals shown include unvalidated device data. ? ?Last Pain:  ?Vitals:  ? 03/26/22 0759  ?TempSrc: Oral  ?   ? ?  ? ?Complications: No notable events documented. ?

## 2022-03-26 NOTE — Anesthesia Postprocedure Evaluation (Signed)
Anesthesia Post Note ? ?Patient: Oluwatosin Bracy ? ?Procedure(s) Performed: Left craniotomy for tumor resection with brainlab (Left: Head) ?APPLICATION OF CRANIAL NAVIGATION (Head) ? ?  ? ?Patient location during evaluation: PACU ?Anesthesia Type: General ?Level of consciousness: awake and alert ?Pain management: pain level controlled ?Vital Signs Assessment: post-procedure vital signs reviewed and stable ?Respiratory status: spontaneous breathing, nonlabored ventilation, respiratory function stable and patient connected to nasal cannula oxygen ?Cardiovascular status: blood pressure returned to baseline and stable ?Postop Assessment: no apparent nausea or vomiting ?Anesthetic complications: no ? ? ?No notable events documented. ? ?Last Vitals:  ?Vitals:  ? 03/26/22 1435 03/26/22 1450  ?BP:  116/73  ?Pulse:  68  ?Resp:  13  ?Temp: 37.2 ?C   ?SpO2:  96%  ?  ?Last Pain:  ?Vitals:  ? 03/26/22 1435  ?TempSrc:   ?PainSc: Asleep  ? ? ?  ?  ?  ?  ?  ?  ? ?Maresa Morash,W. EDMOND ? ? ? ? ?

## 2022-03-26 NOTE — Progress Notes (Signed)
Patient belongings on admission: ?Shoes ?Black sweat shirt ?Underwear ?Bra ?Black pants ?Cell phone ?

## 2022-03-26 NOTE — Op Note (Signed)
PATIENT: Gabriela Herrera ? ?DAY OF SURGERY: 03/26/22 ?  ?PRE-OPERATIVE DIAGNOSIS:  Left frontal brain tumor ?  ?POST-OPERATIVE DIAGNOSIS:  Same ?  ?PROCEDURE:  Left frontal craniotomy, use of frameless stereotactic navigation, use of operating microscope ?  ?SURGEON:  Surgeon(s) and Role: ?   Judith Part, MD - Primary ?  ?ANESTHESIA: ETGA ?  ?BRIEF HISTORY: This is a 36 year old woman who originally presented with an incidental left frontal hypodensity after a CT was done for trauma. She was having symptoms concerning for possible seizures as well as headaches. Repeat imaging showed progression of the lesion, I therefore recommended surgical resection. This was discussed with the patient as well as risks, benefits, and alternatives and wished to proceed with surgery. ?  ?OPERATIVE DETAIL: The patient was taken to the operating room and placed on the OR table in the supine position. A formal time out was performed with two patient identifiers and confirmed the operative site. Anesthesia was induced by the anesthesia team. The [place] head holder was applied to the head and a registration array was attached to the [place]. This was co-registered with the patient's preoperative imaging, the fit appeared to be acceptable. Using frameless stereotaxy, the operative trajectory was planned and the incision was marked. Hair was clipped with surgical clippers over the incision and the area was then prepped and draped in a sterile fashion. ? ?A linear incision was placed in the left frontal region. Soft tissues were retracted, trajectory and landmarks were checked with frameless stereotaxy, and a craniotomy flap was turned in the usual fashion in the left frontal region. The dura was incised and flapped medially. The microscope was draped in a sterile fashion and brought into the field. Dissection was performed along the falx to expose the left mesial frontal lobe. There were significant inter-hemispheric adhesions  that were dissected free. The ACAs were dissected back to their origin and protected, the falx and anterior skull base were identified to use as landmarks to confirm stereotaxy. The boundaries were identified using stereotaxy, after checking for accuracy, and it was then dissected circumferentially and sent to pathology. Margins were again checked with stereotaxy, hemostasis was obtained and confirmed, the wound was copiously irrigated, dura was closed with suture, and the bone flap was replaced with titanium plates and screws.  ? ?All instrument and sponge counts were correct, the incision was then closed in layers. The patient was then returned to anesthesia for emergence. No apparent complications at the completion of the procedure. ?  ?EBL:  174m ?  ?DRAINS: none ?  ?SPECIMENS: Left frontal brain tumor ?  ?TJudith Part MD ?03/26/22 ?10:14 AM ? ?

## 2022-03-26 NOTE — Anesthesia Procedure Notes (Signed)
Arterial Line Insertion ?Start/End4/09/2022 10:34 AM, 03/26/2022 10:39 AM ?Performed by: Roderic Palau, MD ? Patient location: OR. ?Preanesthetic checklist: patient identified, IV checked, site marked, risks and benefits discussed, surgical consent, monitors and equipment checked, pre-op evaluation, timeout performed and anesthesia consent ?Lidocaine 1% used for infiltration ?Left, radial was placed ?Catheter size: 20 G ?Hand hygiene performed , maximum sterile barriers used  and Seldinger technique used ? ?Attempts: 1 ?Procedure performed without using ultrasound guided technique. ?Following insertion, dressing applied and Biopatch. ?Post procedure assessment: normal and unchanged ? ?Patient tolerated the procedure well with no immediate complications. ? ? ?

## 2022-03-26 NOTE — Anesthesia Procedure Notes (Addendum)
Procedure Name: Intubation ?Date/Time: 03/26/2022 10:34 AM ?Performed by: Justin Mend, RN ?Pre-anesthesia Checklist: Patient identified, Emergency Drugs available, Suction available and Patient being monitored ?Patient Re-evaluated:Patient Re-evaluated prior to induction ?Oxygen Delivery Method: Circle System Utilized ?Preoxygenation: Pre-oxygenation with 100% oxygen ?Induction Type: IV induction and Cricoid Pressure applied ?Ventilation: Mask ventilation without difficulty ?Laryngoscope Size: Sabra Heck and 2 ?Grade View: Grade I ?Tube type: Oral ?Number of attempts: 1 ?Airway Equipment and Method: Stylet and Oral airway ?Placement Confirmation: ETT inserted through vocal cords under direct vision, positive ETCO2 and breath sounds checked- equal and bilateral ?Secured at: 22 cm ?Tube secured with: Tape ?Dental Injury: Teeth and Oropharynx as per pre-operative assessment  ?Comments: Performed by Justin Mend, SRNA under direct supervision of MDA and CRNA ? ? ? ? ?

## 2022-03-26 NOTE — Progress Notes (Signed)
Spoke with MD Ostergard on the phone to clarify orders: ?New orders placed for diet and neuro checks.  ?Discussed with Ostergard and pharmacist Arturo Morton about  pain medication and allergies. New verbal orders obtained and placed for tramadol and fentanyl. Discussed pain management plan and medications with patient at bedside. ?

## 2022-03-26 NOTE — Progress Notes (Signed)
?  Transition of Care (TOC) Screening Note ? ? ?Patient Details  ?Name: Gabriela Herrera ?Date of Birth: [birth year] ? ? ?Transition of Care (TOC) CM/SW Contact:    ?Benard Halsted, LCSW ?Phone Number: ?03/26/2022, 2:01 PM ? ? ? ?Transition of Care Department [facility]) has reviewed patient and no TOC needs have been identified at this time. We will continue to monitor patient advancement through interdisciplinary progression rounds. If new patient transition needs arise, please place a TOC consult. ? ? ?

## 2022-03-26 NOTE — H&P (Signed)
Surgical H&P Update ? ?HPI: 36 y.o. 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 No changes in health since they were last seen. Still having the above and wishes to proceed with surgery. ? ?PMHx:  ?Past Medical History:  ?Diagnosis Date  ? ADHD (attention deficit hyperactivity disorder)   ? does not take meds for this  ? Dysrhythmia 2019  ? pt states she was having "PVCs" and Dr. Wynonia Lawman did a cardiac work-up and everything was normal, per pt  ? Hypertension   ? ?FamHx: History reviewed. No pertinent family history. ?SocHx:  reports that she has never smoked. She has never used smokeless tobacco. She reports current alcohol use. She reports that she does not use drugs. ? ?Physical Exam: ?Aox3, PERRL, EOMI, FS & SS, TM, strength 5/5x4 SILTx ? ?Assesment/Plan: ?36 y.o. woman with L frontal neoplasm, here for craniotomy and resection. Risks, benefits, and alternatives discussed and the patient would like to continue with surgery. ? ?-OR today ?-4N ICU post-op ? ?Judith Part, MD ?03/26/22 ?9:57 AM ? ?

## 2022-03-27 ENCOUNTER — Inpatient Hospital Stay (HOSPITAL_COMMUNITY): Payer: Commercial Managed Care - HMO

## 2022-03-27 ENCOUNTER — Encounter (HOSPITAL_COMMUNITY): Payer: Self-pay | Admitting: Neurological Surgery

## 2022-03-27 ENCOUNTER — Other Ambulatory Visit: Payer: Self-pay

## 2022-03-27 MED ORDER — GELATIN ABSORBABLE 100 EX MISC: CUTANEOUS | Status: DC | PRN

## 2022-03-27 MED ORDER — DIPHENHYDRAMINE HCL 25 MG PO CAPS
25.0000 mg | ORAL_CAPSULE | Freq: Four times a day (QID) | ORAL | Status: DC | PRN
  Administered 2022-03-27: 25 mg via ORAL
  Filled 2022-03-27: qty 1

## 2022-03-27 MED ORDER — GELATIN ABSORBABLE MT POWD: OROMUCOSAL | Status: DC | PRN

## 2022-03-27 MED ORDER — TRAMADOL HCL 50 MG PO TABS: 100.0000 mg | ORAL_TABLET | Freq: Three times a day (TID) | ORAL | 0 refills | Status: DC | PRN

## 2022-03-27 MED ORDER — GADOBUTROL 1 MMOL/ML IV SOLN
10.0000 mL | Freq: Once | INTRAVENOUS | Status: AC | PRN
  Administered 2022-03-27: 10 mL via INTRAVENOUS

## 2022-03-27 NOTE — Progress Notes (Signed)
VO from Dr. Zada Finders for OK to travel to MRI without nurse. ?

## 2022-03-27 NOTE — Discharge Summary (Signed)
Discharge Summary ? ?Date of Admission: 03/26/2022 ? ?Date of Discharge: 03/27/22 ? ?Attending Physician: Emelda Brothers, MD ? ?Hospital Course: Patient was admitted following an uncomplicated left frontal craniotomy for tumor resection. They were recovered in PACU and transferred to 4N ICU. Post-operatively, she did well, post-op MRI showed post-op changes with suspected gross total resection, hospital course was uncomplicated and the patient was discharged home on 03/27/22. They will follow up in clinic with me in clinic in 2 weeks. Final path was still pending at the time of discharge ? ?Neurologic exam at discharge:  ?Aox3, PERRL, EOMI, FS &SS, speech fluent with normal content, strength 5/5x4, SILTx4 ? ?Discharge diagnosis: Brain tumor ? ?Judith Part, MD ?03/27/22 ?3:12 PM ? ?

## 2022-03-27 NOTE — Evaluation (Signed)
Occupational Therapy Evaluation ?Patient Details ?Name: Gabriela Herrera ?MRN: [id number] ?DOB: [birth year] ?Today's Date: 03/27/2022 ? ? ?History of Present Illness Patient is a 36 y/o female admitted for L frontal tumor resection/craniotomy.  PMH positive for ADHD, HTN, dysrhythmia.  ? ?Clinical Impression ?  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 Upon evaluation pt reported some "pressure/pain" in her head, and pain with movement due to her foley. Despite being internally distracted, she was able to demonstrated supervision level ADLs and functional mobility. Pt will benefit from continued OT to further assess higher level cognitive tasks, however anticipate pt will do well. Recommend d/c home without follow up OT.  ?   ? ?Recommendations for follow up therapy are one component of a multi-disciplinary discharge planning process, led by the attending physician.  Recommendations may be updated based on patient status, additional functional criteria and insurance authorization.  ? ?Follow Up Recommendations ? No OT follow up  ?  ?Assistance Recommended at Discharge Intermittent Supervision/Assistance  ?Patient can return home with the following Assistance with cooking/housework;Assist for transportation;A little help with bathing/dressing/bathroom ? ?  ?Functional Status Assessment ? Patient has had a recent decline in their functional status and demonstrates the ability to make significant improvements in function in a reasonable and predictable amount of time.  ?Equipment Recommendations ? None recommended by OT  ?  ?Recommendations for Other Services   ? ? ?  ?Precautions / Restrictions Precautions ?Precautions: Fall ?Restrictions ?Weight Bearing Restrictions: No  ? ?  ? ?Mobility Bed Mobility ?Overal bed mobility: Modified Independent ?  ?  ?  ?  ?  ?  ?  ?  ? ?Transfers ?Overall transfer level: Needs assistance ?Equipment used:  None ?Transfers: Sit to/from Stand ?Sit to Stand: Supervision ?  ?  ?  ?  ?  ?General transfer comment: mild LOB, self corrected. cues for safety ?  ? ?  ?Balance Overall balance assessment: Needs assistance ?  ?Sitting balance-Leahy Scale: Good ?  ?  ?  ?Standing balance-Leahy Scale: Good ?Standing balance comment: steady even with gait, but abnormalities likely due to foley ?  ?  ?  ?  ?  ?  ?  ?  ?  ?  ?  ?   ? ?ADL either performed or assessed with clinical judgement  ? ?ADL Overall ADL's : Needs assistance/impaired ?  ?  ?  ?  ?  ?  ?  ?  ?  ?  ?  ?  ?  ?  ?  ?  ?  ?  ?Functional mobility during ADLs: Supervision/safety ?General ADL Comments: pt is Supervision overall for all ADLs for safety only. Anticipate progress toward mod I quickly. Pt internally distracted by pain and foley this date.  ? ? ? ?Vision Baseline Vision/History: 0 No visual deficits ?Vision Assessment?: No apparent visual deficits  ?   ?Perception   ?  ?Praxis   ?  ? ?Pertinent Vitals/Pain Pain Assessment ?Pain Assessment: Faces ?Faces Pain Scale: Hurts a little bit ?Pain Location: headache front and back ?Pain Descriptors / Indicators: Aching, Headache ?Pain Intervention(s): Limited activity within patient's tolerance, Monitored during session  ? ? ? ?Hand Dominance Right ?  ?Extremity/Trunk Assessment Upper Extremity Assessment ?Upper Extremity Assessment: Overall WFL for tasks assessed ?  ?Lower Extremity Assessment ?Lower Extremity Assessment: Overall WFL for tasks assessed ?  ?Cervical / Trunk Assessment ?Cervical / Trunk Assessment: Other  exceptions ?Cervical / Trunk Exceptions: crani/resection ?  ?Communication Communication ?Communication: No difficulties ?  ?Cognition Arousal/Alertness: Awake/alert ?Behavior During Therapy: [facility] for tasks assessed/performed ?Overall Cognitive Status: Within Functional Limits for tasks assessed ?  ?  ?  ?  ?  ?  ?  ?  ?  ?  ?  ?  ?  ?  ?  ?  ?General Comments: flat throughout but overall WFL - would  benefit from higher level cog testing ?  ?  ?General Comments  pt's father present and supportive ? ?  ?Exercises   ?  ?Shoulder Instructions    ? ? ?Home Living Family/patient expects to be discharged to:: Private residence ?Living Arrangements: Spouse/significant other;Children ?Available Help at Discharge: Family ?Type of Home: House ?Home Access: Level entry ?  ?  ?Home Layout: One level ?  ?  ?Bathroom Shower/Tub: Tub/shower unit ?  ?Bathroom Toilet: Standard ?  ?  ?Home Equipment: None ?  ?  ?  ? ?  ?Prior Functioning/Environment Prior Level of Function : Independent/Modified Independent ?  ?  ?  ?  ?  ?  ?Mobility Comments: indep ?ADLs Comments: indep including working as an Glass blower/designer ?  ? ?  ?  ?OT Problem List: Impaired balance (sitting and/or standing);Decreased safety awareness;Decreased knowledge of precautions ?  ?   ?OT Treatment/Interventions: Self-care/ADL training;Therapeutic exercise;Modalities;Balance training;Patient/family education  ?  ?OT Goals(Current goals can be found in the care plan section) Acute Rehab OT Goals ?Patient Stated Goal: home ?OT Goal Formulation: With patient ?Time For Goal Achievement: 04/10/22 ?Potential to Achieve Goals: Good ?ADL Goals ?Additional ADL Goal #1: Pt will indep complete IADL money management task in distracting environment ?Additional ADL Goal #2: Pt will complete all ADLs independently  ?OT Frequency: Min 2X/week ?  ? ?Co-evaluation   ?  ?  ?  ?  ? ?  ?AM-PAC OT "6 Clicks" Daily Activity     ?Outcome Measure Help from another person eating meals?: None ?Help from another person taking care of personal grooming?: None ?Help from another person toileting, which includes using toliet, bedpan, or urinal?: A Little ?Help from another person bathing (including washing, rinsing, drying)?: A Little ?Help from another person to put on and taking off regular upper body clothing?: A Little ?Help from another person to put on and taking off regular lower body  clothing?: A Little ?6 Click Score: 20 ?  ?End of Session Nurse Communication: Mobility status ? ?Activity Tolerance: Patient tolerated treatment well ?Patient left: in bed;with call bell/phone within reach;with bed alarm set;with family/visitor present ? ?OT Visit Diagnosis: Unsteadiness on feet (R26.81);Pain  ?              ?Time: 2952-8413 ?OT Time Calculation (min): 17 min ?Charges:  OT General Charges ?$OT Visit: 1 Visit ?OT Evaluation ?$OT Eval Low Complexity: 1 Low ? ? ?Dorsey Charette A Belen Pesch ?03/27/2022, 12:19 PM ?

## 2022-03-27 NOTE — Progress Notes (Signed)
Neurosurgery Service ?Progress Note ? ?Subjective: No acute events overnight, some headaches but no new neurologic complaints, overall doing well  ? ?Objective: ?Vitals:  ? 03/27/22 0700 03/27/22 0800 03/27/22 0900 03/27/22 1000  ?BP: 108/62 112/72 113/76 114/73  ?Pulse: 67 71 77 77  ?Resp: '15 15 13 16  '$ ?Temp:  98.4 ?F (36.9 ?C)    ?TempSrc:  Oral    ?SpO2: 95% 95% 96% 95%  ?Weight:      ?Height:      ? ? ?Physical Exam: ?Aox3, PERRL, EOMI, FS & SS, speech fluent with normal content, Fcx4 with full strength, no drift, incision c/d/I  ? ?Assessment & Plan: ?36 y.o. woman s/p L F crani for tumor resection, recovering well. ? ?-MRI today ?-discharge home from unit this afternoon versus transfer to floor, will see how she's doing clinically ?-d/c A-line / foley ?-PT/OT ?-SCDs/TEDs, SQH POD2 ? ?Marcello Moores A [place] Maulden  ?03/27/22 ?11:06 AM ? ?

## 2022-03-27 NOTE — Progress Notes (Signed)
Patient with complaints of itching since taking Norco this AM. VO from Dr. Zada Finders for benadryl '25mg'$  PO q6h for itching. ?

## 2022-03-27 NOTE — Evaluation (Signed)
Physical Therapy Evaluation ?Patient Details ?Name: Gabriela Herrera ?MRN: [id number] ?DOB: Jun 27, 1986 ?Today's Date: 03/27/2022 ? ?History of Present Illness ? Patient is a 36 y/o female admitted for L frontal tumor resection/craniotomy.  PMH positive for ADHD, HTN, dysrhythmia.  ?Clinical Impression ? Patient presents with decreased mobility due to deficits listed in PT problem list.  Currently with A for balance with deficits related to medical devices.  She should continue to progress and likely not need further therapy even acutely, but will check one more session if not d/c for higher level balance testing.    ?   ? ?Recommendations for follow up therapy are one component of a multi-disciplinary discharge planning process, led by the attending physician.  Recommendations may be updated based on patient status, additional functional criteria and insurance authorization. ? ?Follow Up Recommendations No PT follow up ? ?  ?Assistance Recommended at Discharge    ?Patient can return home with the following ? Assist for transportation;Help with stairs or ramp for entrance;Assistance with cooking/housework ? ?  ?Equipment Recommendations None recommended by PT  ?Recommendations for Other Services ?    ?  ?Functional Status Assessment Patient has had a recent decline in their functional status and demonstrates the ability to make significant improvements in function in a reasonable and predictable amount of time.  ? ?  ?Precautions / Restrictions Precautions ?Precautions: Fall  ? ?  ? ?Mobility ? Bed Mobility ?Overal bed mobility: Modified Independent ?  ?  ?  ?  ?  ?  ?  ?  ? ?Transfers ?Overall transfer level: Needs assistance ?  ?Transfers: Sit to/from Stand ?Sit to Stand: Min guard ?  ?  ?  ?  ?  ?General transfer comment: for safety and lines ?  ? ?Ambulation/Gait ?Ambulation/Gait assistance: Min guard, Supervision ?Gait Distance (Feet): 90 Feet ?Assistive device: IV Pole ?Gait Pattern/deviations: Step-through pattern,  Decreased stride length, Shuffle, Wide base of support ?  ?  ?  ?General Gait Details: pushing IV pole due to lines and foley with wide BOS and shorter stride length c/o discomfort from foley ? ?Stairs ?  ?  ?  ?  ?  ? ?Wheelchair Mobility ?  ? ?Modified Rankin (Stroke Patients Only) ?  ? ?  ? ?Balance Overall balance assessment: Needs assistance ?  ?Sitting balance-Leahy Scale: Good ?  ?  ?  ?Standing balance-Leahy Scale: Good ?Standing balance comment: steady even with gait, but abnormalities likely due to foley ?  ?  ?  ?  ?  ?  ?  ?  ?  ?  ?  ?   ? ? ? ?Pertinent Vitals/Pain Pain Assessment ?Pain Assessment: 0-10 ?Pain Score: 2  ?Pain Location: headache front and back ?Pain Descriptors / Indicators: Aching, Headache ?Pain Intervention(s): Monitored during session  ? ? ?Home Living Family/patient expects to be discharged to:: Private residence ?Living Arrangements: Spouse/significant other;Children ?Available Help at Discharge: Family ?Type of Home: House ?Home Access: Level entry ?  ?  ?  ?Home Layout: One level ?Home Equipment: None ?   ?  ?Prior Function Prior Level of Function : Independent/Modified Independent ?  ?  ?  ?  ?  ?  ?Mobility Comments: performed all IADL's/ADL's independent ?  ?  ? ? ?Hand Dominance  ?   ? ?  ?Extremity/Trunk Assessment  ? Upper Extremity Assessment ?Upper Extremity Assessment: Overall WFL for tasks assessed ?  ? ?Lower Extremity Assessment ?Lower Extremity Assessment: Overall WFL for tasks assessed ?  ? ?   ?  Communication  ? Communication: No difficulties  ?Cognition Arousal/Alertness: Awake/alert ?Behavior During Therapy: [facility] for tasks assessed/performed ?Overall Cognitive Status: Within Functional Limits for tasks assessed ?  ?  ?  ?  ?  ?  ?  ?  ?  ?  ?  ?  ?  ?  ?  ?  ?  ?  ?  ? ?  ?General Comments General comments (skin integrity, edema, etc.): significant other in the room and supportive, reports kids on spring break this week and father lives close also ? ?  ?Exercises     ? ?Assessment/Plan  ?  ?PT Assessment Patient needs continued PT services  ?PT Problem List Decreased mobility;Decreased activity tolerance;Decreased safety awareness ? ?   ?  ?PT Treatment Interventions DME instruction;Therapeutic activities;Gait training;Patient/family education;Functional mobility training   ? ?PT Goals (Current goals can be found in the Care Plan section)  ?Acute Rehab PT Goals ?Patient Stated Goal: to return to independent ?PT Goal Formulation: With patient ?Time For Goal Achievement: 03/30/22 ?Potential to Achieve Goals: Good ? ?  ?Frequency Min 4X/week ?  ? ? ?Co-evaluation   ?  ?  ?  ?  ? ? ?  ?AM-PAC PT "6 Clicks" Mobility  ?Outcome Measure Help needed turning from your back to your side while in a flat bed without using bedrails?: None ?Help needed moving from lying on your back to sitting on the side of a flat bed without using bedrails?: None ?Help needed moving to and from a bed to a chair (including a wheelchair)?: A Little ?Help needed standing up from a chair using your arms (e.g., wheelchair or bedside chair)?: A Little ?Help needed to walk in hospital room?: A Little ?Help needed climbing 3-5 steps with a railing? : Total ?6 Click Score: 18 ? ?  ?End of Session Equipment Utilized During Treatment: Gait belt ?Activity Tolerance: Patient tolerated treatment well ?Patient left: in chair;with call bell/phone within reach ?  ?PT Visit Diagnosis: Other abnormalities of gait and mobility (R26.89) ?  ? ?Time: 1962-2297 ?PT Time Calculation (min) (ACUTE ONLY): 21 min ? ? ?Charges:   PT Evaluation ?$PT Eval Low Complexity: 1 Low ?  ?  ?   ? ? ?Magda Kiel, PT ?Acute Rehabilitation Services ?LGXQJ:[phone] ?Office:[phone] ?03/27/2022 ? ? ?Reginia Naas ?03/27/2022, 11:06 AM ? ?

## 2022-04-02 ENCOUNTER — Inpatient Hospital Stay: Payer: Managed Care, Other (non HMO)

## 2022-04-05 ENCOUNTER — Encounter (HOSPITAL_COMMUNITY): Payer: Self-pay

## 2022-04-13 ENCOUNTER — Telehealth: Payer: Self-pay | Admitting: Internal Medicine

## 2022-04-13 ENCOUNTER — Other Ambulatory Visit: Payer: Self-pay | Admitting: Radiation Therapy

## 2022-04-13 NOTE — Telephone Encounter (Signed)
Per 4/28 phone lines, pt called to schedule appointment pt confirmed appointment  ?

## 2022-04-19 ENCOUNTER — Other Ambulatory Visit: Payer: Self-pay

## 2022-04-19 ENCOUNTER — Inpatient Hospital Stay: Payer: Commercial Managed Care - HMO | Attending: Internal Medicine | Admitting: Internal Medicine

## 2022-04-19 VITALS — BP 121/79 | HR 86 | Temp 97.5°F | Resp 20 | Wt 221.4 lb

## 2022-04-19 DIAGNOSIS — C711 Malignant neoplasm of frontal lobe: Secondary | ICD-10-CM | POA: Diagnosis present

## 2022-04-19 DIAGNOSIS — R569 Unspecified convulsions: Secondary | ICD-10-CM | POA: Diagnosis not present

## 2022-04-19 DIAGNOSIS — Z79899 Other long term (current) drug therapy: Secondary | ICD-10-CM | POA: Insufficient documentation

## 2022-04-19 DIAGNOSIS — C719 Malignant neoplasm of brain, unspecified: Secondary | ICD-10-CM
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
## 2022-04-23 ENCOUNTER — Telehealth: Payer: Self-pay | Admitting: Internal Medicine

## 2022-04-23 NOTE — Telephone Encounter (Signed)
Scheduled per 5/4 los, pt has been called and confirmed  ?

## 2022-04-26 ENCOUNTER — Encounter (HOSPITAL_COMMUNITY): Payer: Self-pay | Admitting: Neurological Surgery

## 2022-04-26 LAB — SURGICAL PATHOLOGY

## 2022-05-29 ENCOUNTER — Other Ambulatory Visit: Payer: Self-pay | Admitting: Radiation Therapy

## 2022-06-15 ENCOUNTER — Ambulatory Visit
Admission: RE | Admit: 2022-06-15 | Discharge: 2022-06-15 | Disposition: A | Discharge status: Home / Self Care | Payer: Commercial Managed Care - HMO | Source: Ambulatory Visit | Attending: Internal Medicine | Admitting: Internal Medicine

## 2022-06-15 DIAGNOSIS — C719 Malignant neoplasm of brain, unspecified: Secondary | ICD-10-CM

## 2022-06-15 MED ORDER — GADOBENATE DIMEGLUMINE 529 MG/ML IV SOLN
20.0000 mL | Freq: Once | INTRAVENOUS | Status: AC | PRN
  Administered 2022-06-15: 20 mL via INTRAVENOUS

## 2022-06-18 ENCOUNTER — Ambulatory Visit: Payer: Managed Care, Other (non HMO) | Admitting: Internal Medicine

## 2022-06-18 ENCOUNTER — Other Ambulatory Visit: Payer: Self-pay

## 2022-06-18 ENCOUNTER — Inpatient Hospital Stay: Payer: Commercial Managed Care - HMO | Attending: Internal Medicine | Admitting: Internal Medicine

## 2022-06-18 VITALS — BP 124/78 | HR 88 | Temp 98.5°F | Resp 17 | Ht 67.0 in | Wt 220.8 lb

## 2022-06-18 DIAGNOSIS — C719 Malignant neoplasm of brain, unspecified: Secondary | ICD-10-CM

## 2022-06-18 DIAGNOSIS — R569 Unspecified convulsions: Secondary | ICD-10-CM | POA: Diagnosis not present

## 2022-06-18 DIAGNOSIS — Z79899 Other long term (current) drug therapy: Secondary | ICD-10-CM | POA: Insufficient documentation

## 2022-06-18 DIAGNOSIS — C711 Malignant neoplasm of frontal lobe: Secondary | ICD-10-CM | POA: Insufficient documentation

## 2022-06-18 MED ORDER — LORAZEPAM 1 MG PO TABS: 1.0000 mg | ORAL_TABLET | Freq: Once | ORAL | 0 refills | Status: DC | PRN
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
## 2022-06-20 ENCOUNTER — Telehealth: Payer: Self-pay | Admitting: Internal Medicine

## 2022-06-20 NOTE — Telephone Encounter (Signed)
Per 7/3 los called and spoke pt   pt confirmed appointment

## 2022-06-21 ENCOUNTER — Other Ambulatory Visit: Payer: Self-pay | Admitting: Radiation Therapy

## 2022-07-21 ENCOUNTER — Other Ambulatory Visit: Payer: Self-pay

## 2022-07-21 ENCOUNTER — Emergency Department (HOSPITAL_COMMUNITY)
Admission: EM | Admit: 2022-07-21 | Discharge: 2022-07-22 | Disposition: A | Discharge status: Home / Self Care | Payer: Worker's Compensation | Attending: Emergency Medicine | Admitting: Emergency Medicine

## 2022-07-21 DIAGNOSIS — Y990 Civilian activity done for income or pay: Secondary | ICD-10-CM | POA: Diagnosis not present

## 2022-07-21 DIAGNOSIS — S46912A Strain of unspecified muscle, fascia and tendon at shoulder and upper arm level, left arm, initial encounter: Secondary | ICD-10-CM

## 2022-07-21 DIAGNOSIS — W19XXXA Unspecified fall, initial encounter: Secondary | ICD-10-CM

## 2022-07-21 DIAGNOSIS — W01198A Fall on same level from slipping, tripping and stumbling with subsequent striking against other object, initial encounter: Secondary | ICD-10-CM | POA: Diagnosis not present

## 2022-07-21 DIAGNOSIS — M546 Pain in thoracic spine: Secondary | ICD-10-CM | POA: Insufficient documentation

## 2022-07-21 DIAGNOSIS — S300XXA Contusion of lower back and pelvis, initial encounter: Secondary | ICD-10-CM

## 2022-07-21 DIAGNOSIS — M545 Low back pain, unspecified: Secondary | ICD-10-CM | POA: Diagnosis not present

## 2022-07-21 DIAGNOSIS — M25512 Pain in left shoulder: Secondary | ICD-10-CM | POA: Insufficient documentation

## 2022-07-21 NOTE — ED Triage Notes (Signed)
Workers Comp - pt sustained a fall in the walk-in cooler while at work last night at [employer]. She has pain to left foot and ankle, lower back radiating to upper back, left hip, and left shoulder pain. Currently rates pain 3/10 at rest, 9-10/10 with movement.

## 2022-07-22 ENCOUNTER — Emergency Department (HOSPITAL_COMMUNITY): Payer: Commercial Managed Care - HMO

## 2022-07-22 MED ORDER — CYCLOBENZAPRINE HCL 10 MG PO TABS: 10.0000 mg | ORAL_TABLET | Freq: Three times a day (TID) | ORAL | 0 refills | Status: DC | PRN

## 2022-07-22 MED ORDER — CYCLOBENZAPRINE HCL 10 MG PO TABS
10.0000 mg | ORAL_TABLET | Freq: Once | ORAL | Status: AC
  Administered 2022-07-22: 10 mg via ORAL
  Filled 2022-07-22: qty 1

## 2022-07-22 MED ORDER — NAPROXEN 500 MG PO TABS: 500.0000 mg | ORAL_TABLET | Freq: Two times a day (BID) | ORAL | 0 refills | Status: DC

## 2022-07-22 MED ORDER — KETOROLAC TROMETHAMINE 60 MG/2ML IM SOLN
60.0000 mg | Freq: Once | INTRAMUSCULAR | Status: AC
  Administered 2022-07-22: 60 mg via INTRAMUSCULAR
  Filled 2022-07-22: qty 2

## 2022-07-22 NOTE — Discharge Instructions (Signed)
Begin taking naproxen as prescribed.  Begin taking Flexeril as prescribed as needed for pain not relieved with naproxen.  Follow-up with your primary doctor if symptoms are not improving in the next week. 

## 2022-07-22 NOTE — ED Provider Notes (Signed)
[facility] DEPT Provider Note   CSN: [id number] Arrival date & time: 07/21/22  2120     History  Chief Complaint  Patient presents with   Gabriela Herrera is a 36 y.o. female.  Patient is a 36 year old female with past medical history of astrocytoma status postresection in March 2023.  Patient presenting today with complaints of pain in her back and left shoulder resulting from a fall.  Patient was working 2 nights ago at [employer]  She walked into the walk-in cooler, then slipped and fell, landing on a shelf and striking her low back.  She has been having pain in her back since.  This evening while she was cleaning an oven, she felt a pull in her left shoulder/upper back and has been having pain since.  She reports some numbness to the top of her hand, but denies any weakness.  She denies any bowel or bladder complaints.  She denies any weakness of the lower extremities.  The history is provided by the patient.       Home Medications Prior to Admission medications   Medication Sig Start Date End Date Taking? Authorizing Provider  lamoTRIgine (LAMICTAL) 100 MG tablet Take 1 tablet (100 mg total) by mouth 2 (two) times daily. Patient taking differently: Take 100 mg by mouth daily. 01/11/22   Ventura Sellers, MD  lisinopril-hydrochlorothiazide (ZESTORETIC) 10-12.5 MG tablet Take 1 tablet by mouth daily. 03/05/20   [provider]  LORazepam (ATIVAN) 1 MG tablet Take 1 tablet (1 mg total) by mouth once as needed for up to 1 dose for anxiety (MRI anxiety). 06/18/22   Vaslow, Acey Lav, MD  montelukast (SINGULAIR) 10 MG tablet Take 1 tablet by mouth daily. 05/23/22   [provider]  Multiple Vitamin (MULTIVITAMIN WITH MINERALS) TABS tablet Take 2 tablets by mouth daily.    [provider]  pantoprazole (PROTONIX) 20 MG tablet Take 1 tablet (20 mg total) by mouth daily. Patient taking differently: Take 40 mg by mouth daily.  01/13/18   Antonietta Breach, PA-C      Allergies    Codeine, Hydromorphone, Gluten meal, Keppra [levetiracetam], Other, and Promethazine hcl    Review of Systems   Review of Systems  All other systems reviewed and are negative.   Physical Exam Updated Vital Signs BP 117/83   Pulse (!) 57   Temp 98.2 F (36.8 C) (Oral)   Resp 18   Ht '5\' 7"'$  (1.702 m)   Wt 97.5 kg   SpO2 100%   BMI 33.67 kg/m  Physical Exam Vitals and nursing note reviewed.  Constitutional:      General: She is not in acute distress.    Appearance: She is well-developed. She is not diaphoretic.  HENT:     Head: Normocephalic and atraumatic.  Cardiovascular:     Rate and Rhythm: Normal rate and regular rhythm.     Heart sounds: No murmur heard.    No friction rub. No gallop.  Pulmonary:     Effort: Pulmonary effort is normal. No respiratory distress.     Breath sounds: Normal breath sounds. No wheezing.  Abdominal:     General: Bowel sounds are normal. There is no distension.     Palpations: Abdomen is soft.     Tenderness: There is no abdominal tenderness.  Musculoskeletal:        General: Normal range of motion.     Cervical back: Normal range of motion  and neck supple.     Comments: The left shoulder appears grossly normal.  She has pain with any range of motion.  There is tenderness to palpation overlying the scapula and paraspinal musculature between the scapula and spine.  Ulnar and radial pulses are easily palpable and motor and sensation are intact throughout the entire hand.  There is tenderness to palpation in the lower lumbar region.  There is no bony tenderness or step-off.  Strength is 5 out of 5 in both lower extremities.  She is ambulatory without difficulty.  Skin:    General: Skin is warm and dry.  Neurological:     General: No focal deficit present.     Mental Status: She is alert and oriented to person, place, and time.     ED Results / Procedures / Treatments   Labs (all labs  ordered are listed, but only abnormal results are displayed) Labs Reviewed  PREGNANCY, URINE    EKG None  Radiology No results found.  Procedures Procedures    Medications Ordered in ED Medications  ketorolac (TORADOL) injection 60 mg (has no administration in time range)  cyclobenzaprine (FLEXERIL) tablet 10 mg (has no administration in time range)    ED Course/ Medical Decision Making/ A&P  Patient is a 36 year old female presenting with complaints of left shoulder pain and low back pain after a fall 2 nights ago while at work.  A CT lumbar was obtained showing no evidence for fracture.  The left shoulder x-rays are negative.  Patient will be discharged with anti-inflammatories, muscle relaxers, and follow-up with primary doctor if not improving.  Final Clinical Impression(s) / ED Diagnoses Final diagnoses:  None    Rx / DC Orders ED Discharge Orders     None         Veryl Speak, MD 07/22/22 0150

## 2022-08-21 ENCOUNTER — Telehealth: Payer: Self-pay | Admitting: *Deleted

## 2022-08-21 MED ORDER — LORAZEPAM 1 MG PO TABS
1.0000 mg | ORAL_TABLET | Freq: Once | ORAL | 0 refills | Status: AC | PRN
Start: 2022-08-21 — End: ?

## 2022-08-21 NOTE — Addendum Note (Signed)
Addended by: Ventura Sellers on: 08/21/2022 04:08 PM   Modules accepted: Orders

## 2022-08-21 NOTE — Telephone Encounter (Signed)
Patient called stating that she has MRI coming up at end of September and needs anxiety medication Lorazepam refilled to [facility] on file.  Routed to Provider to refill.

## 2022-09-14 ENCOUNTER — Ambulatory Visit
Admission: RE | Admit: 2022-09-14 | Discharge: 2022-09-14 | Disposition: A | Discharge status: Home / Self Care | Payer: Commercial Managed Care - HMO | Source: Ambulatory Visit | Attending: Internal Medicine | Admitting: Internal Medicine

## 2022-09-14 DIAGNOSIS — C719 Malignant neoplasm of brain, unspecified: Secondary | ICD-10-CM

## 2022-09-14 MED ORDER — GADOBENATE DIMEGLUMINE 529 MG/ML IV SOLN
20.0000 mL | Freq: Once | INTRAVENOUS | Status: AC | PRN
  Administered 2022-09-14: 20 mL via INTRAVENOUS

## 2022-09-17 ENCOUNTER — Inpatient Hospital Stay: Payer: Commercial Managed Care - HMO | Attending: Internal Medicine

## 2022-09-17 DIAGNOSIS — R569 Unspecified convulsions: Secondary | ICD-10-CM | POA: Insufficient documentation

## 2022-09-17 DIAGNOSIS — C711 Malignant neoplasm of frontal lobe: Secondary | ICD-10-CM | POA: Insufficient documentation

## 2022-09-18 ENCOUNTER — Inpatient Hospital Stay (HOSPITAL_BASED_OUTPATIENT_CLINIC_OR_DEPARTMENT_OTHER): Payer: Commercial Managed Care - HMO | Admitting: Internal Medicine

## 2022-09-18 ENCOUNTER — Encounter: Payer: Self-pay | Admitting: Internal Medicine

## 2022-09-18 VITALS — BP 106/72 | HR 68 | Temp 98.1°F | Resp 20 | Ht 67.0 in | Wt 212.0 lb

## 2022-09-18 DIAGNOSIS — C719 Malignant neoplasm of brain, unspecified: Secondary | ICD-10-CM

## 2022-09-18 DIAGNOSIS — R569 Unspecified convulsions: Secondary | ICD-10-CM | POA: Diagnosis not present

## 2022-09-18 DIAGNOSIS — C711 Malignant neoplasm of frontal lobe: Secondary | ICD-10-CM | POA: Diagnosis present
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
## 2022-09-20 ENCOUNTER — Other Ambulatory Visit: Payer: Self-pay | Admitting: Radiation Therapy

## 2022-10-01 ENCOUNTER — Other Ambulatory Visit: Payer: Self-pay | Admitting: Internal Medicine

## 2023-03-05 ENCOUNTER — Other Ambulatory Visit: Payer: Self-pay | Admitting: Internal Medicine

## 2023-03-14 ENCOUNTER — Ambulatory Visit
Admission: RE | Admit: 2023-03-14 | Discharge: 2023-03-14 | Disposition: A | Discharge status: Home / Self Care | Payer: Commercial Managed Care - HMO | Source: Ambulatory Visit | Attending: Internal Medicine | Admitting: Internal Medicine

## 2023-03-14 DIAGNOSIS — C719 Malignant neoplasm of brain, unspecified: Secondary | ICD-10-CM

## 2023-03-14 MED ORDER — GADOPICLENOL 0.5 MMOL/ML IV SOLN
9.0000 mL | Freq: Once | INTRAVENOUS | Status: AC | PRN
  Administered 2023-03-14: 9 mL via INTRAVENOUS

## 2023-03-19 ENCOUNTER — Inpatient Hospital Stay: Payer: Commercial Managed Care - HMO | Attending: Internal Medicine | Admitting: Internal Medicine

## 2023-03-19 ENCOUNTER — Telehealth: Payer: Self-pay | Admitting: Internal Medicine

## 2023-03-19 ENCOUNTER — Other Ambulatory Visit: Payer: Self-pay

## 2023-03-19 VITALS — BP 101/71 | HR 66 | Temp 97.6°F | Resp 14 | Wt 207.0 lb

## 2023-03-19 DIAGNOSIS — C711 Malignant neoplasm of frontal lobe: Secondary | ICD-10-CM | POA: Insufficient documentation

## 2023-03-19 DIAGNOSIS — R569 Unspecified convulsions: Secondary | ICD-10-CM | POA: Diagnosis not present

## 2023-03-19 DIAGNOSIS — C719 Malignant neoplasm of brain, unspecified: Secondary | ICD-10-CM | POA: Diagnosis not present

## 2023-03-19 DIAGNOSIS — Z79899 Other long term (current) drug therapy: Secondary | ICD-10-CM | POA: Diagnosis not present
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
## 2023-03-19 NOTE — Telephone Encounter (Signed)
Reached out to patient to schedule per 4/2 LOS, left voicemail.

## 2023-06-12 IMAGING — MR MR HEAD WO/W CM
8 of 17 series · 15 of 48 positions shown · IV contrast (agent unspecified)
Comparison: MRI of the brain January 03, 2022.

CLINICAL DATA: Brain/CNS neoplasm, assess treatment response; s/p
craniotomy for tumor resection.

EXAM:
MRI HEAD WITHOUT AND WITH CONTRAST
TECHNIQUE: Multiplanar, multiecho pulse sequences of the brain and surrounding
structures were obtained without and with intravenous contrast.

[Series 2: DWI · axial · 3.0mm · 0.94mm/px · 1 of 95 slices shown (1 of 2)]
[im 1/95]
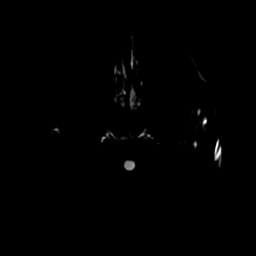

[Series 3: DWI · coronal · 4.0mm · 0.94mm/px · 2 of 74 slices shown (2 of 2)]
[im 1/74]
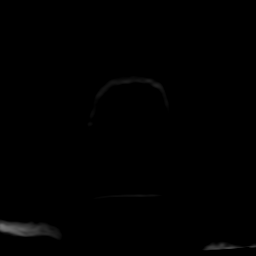
[im 74/74]
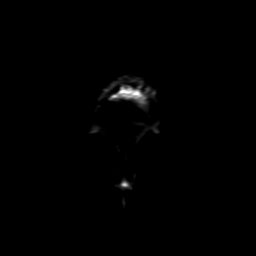

[Series 4: FLAIR · sagittal · 5.0mm · 0.23mm/px · 1 of 24 slices shown (1 of 2)]
[im 1/24]
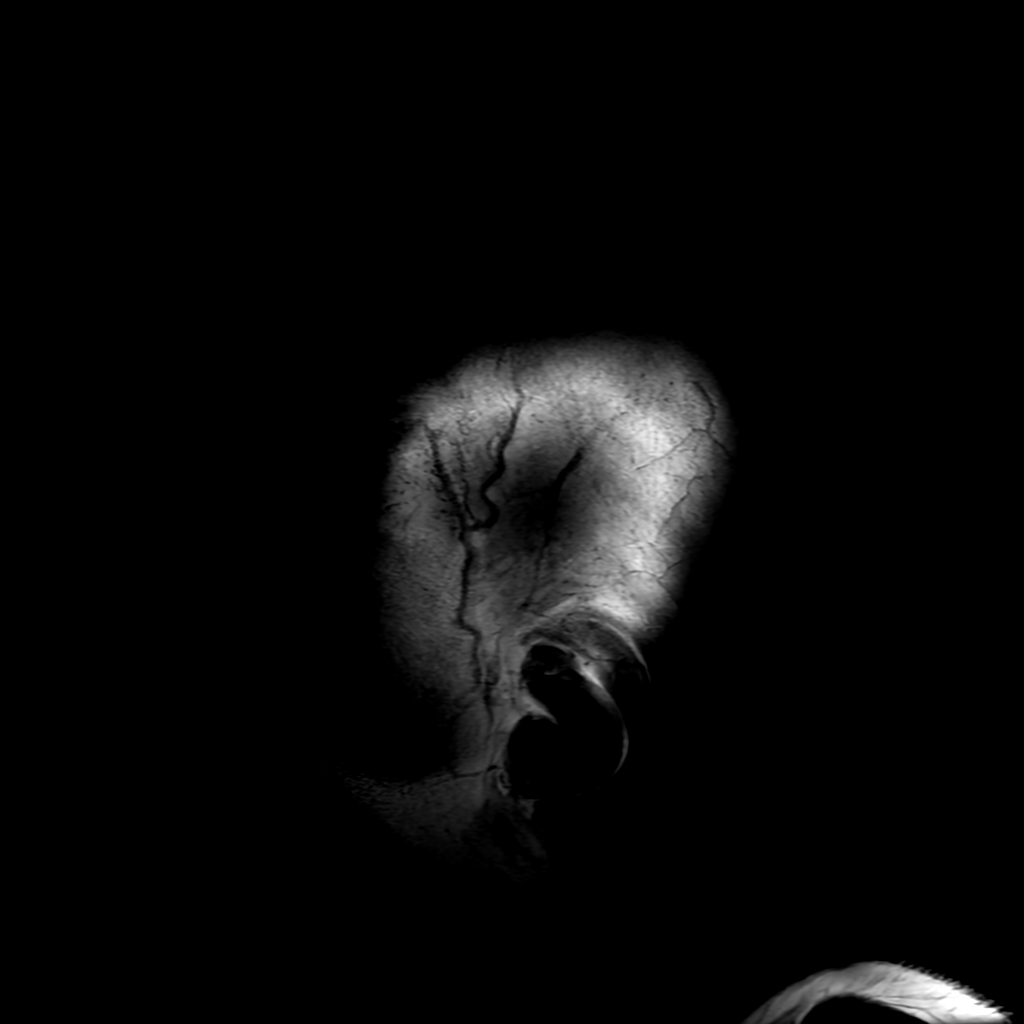

[Series 5: T2 · axial · 5.0mm · 0.23mm/px · 1 of 26 slices shown]
[im 1/26]
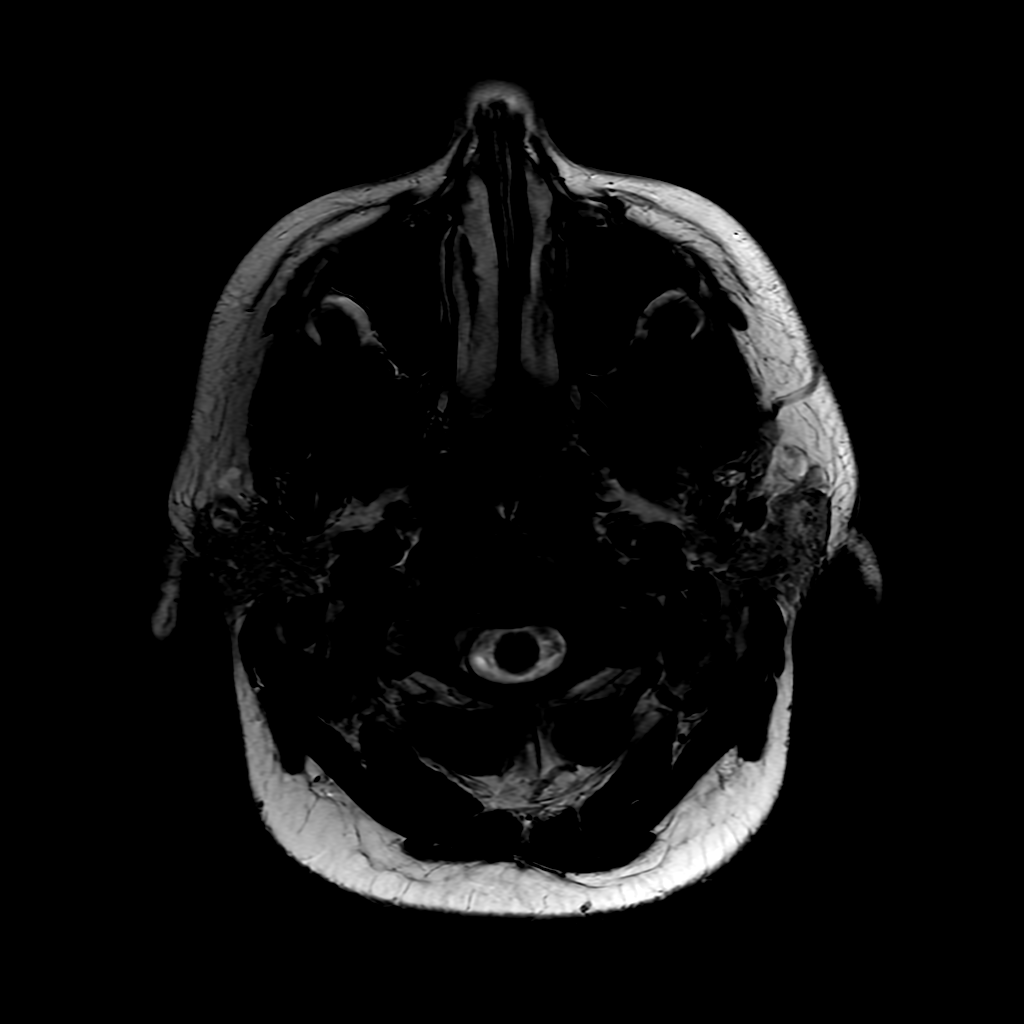

[Series 6: FLAIR · axial · 4.0mm · 0.45mm/px · 1 of 34 slices shown (2 of 2)]
[im 1/34]
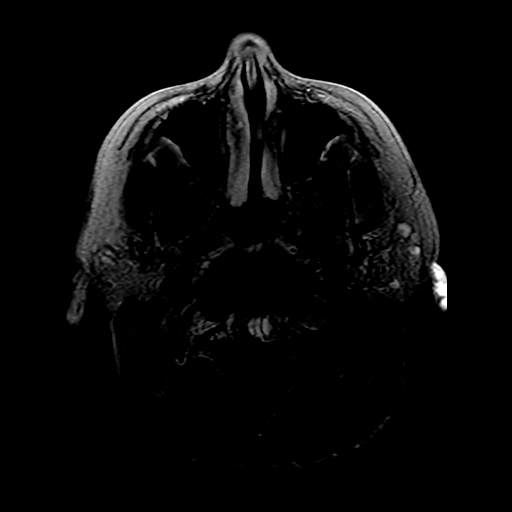

[Series 250: ADC · axial · 3.0mm · 0.94mm/px · 1 of 48 slices shown (1 of 2)]
[im 1/48]
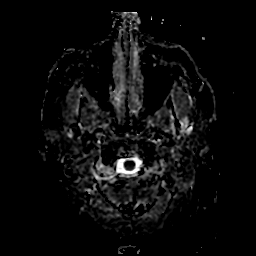

[Series 350: ADC · coronal · 4.0mm · 0.94mm/px · 1 of 37 slices shown (2 of 2)]
[im 1/37]
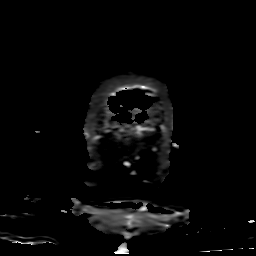

[Series 1300: T1 post-contrast · axial · 0.9mm · 0.50mm/px · z∈[-140,+114]mm · 7 of 295 slices shown]
[im 1/295]
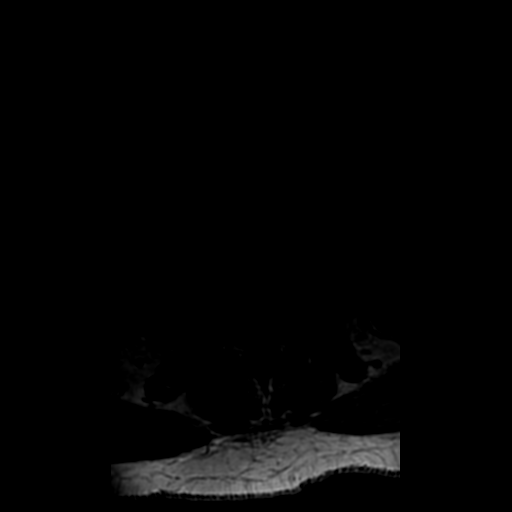
[im 50/295]
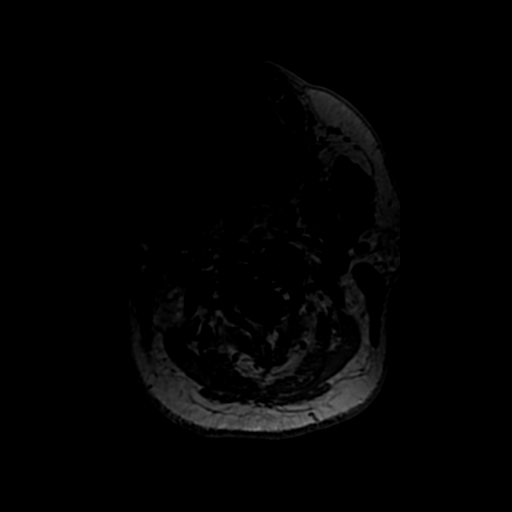
[im 99/295]
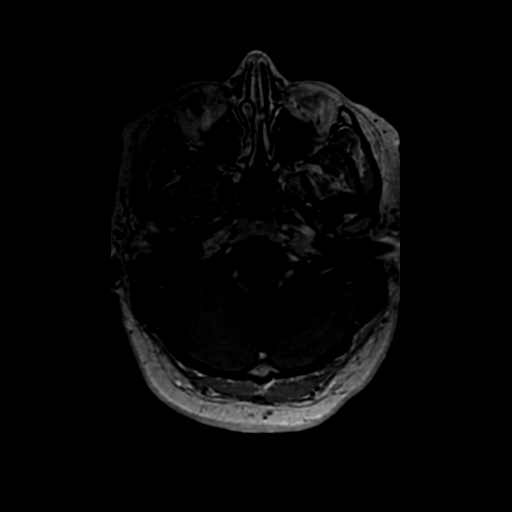
[im 148/295]
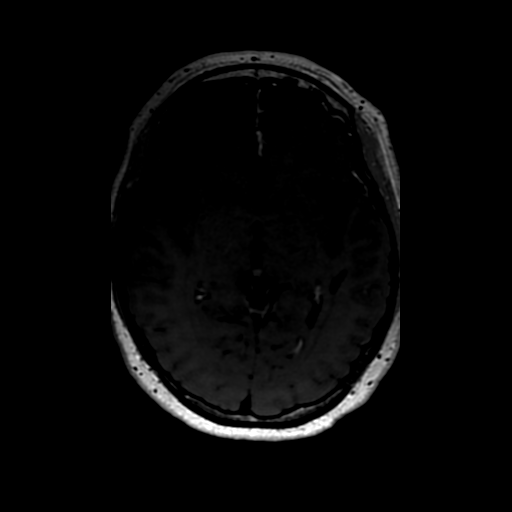
[im 197/295]
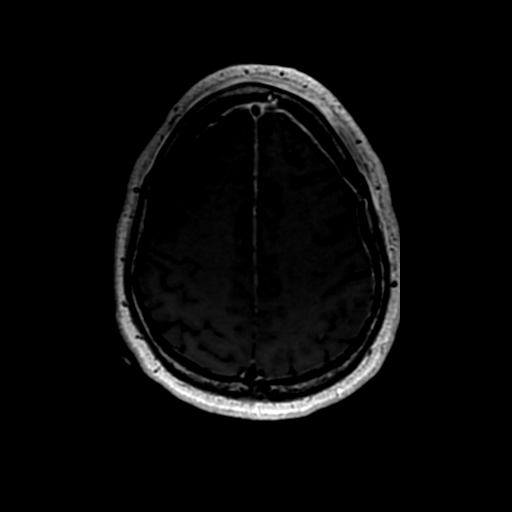
[im 246/295]
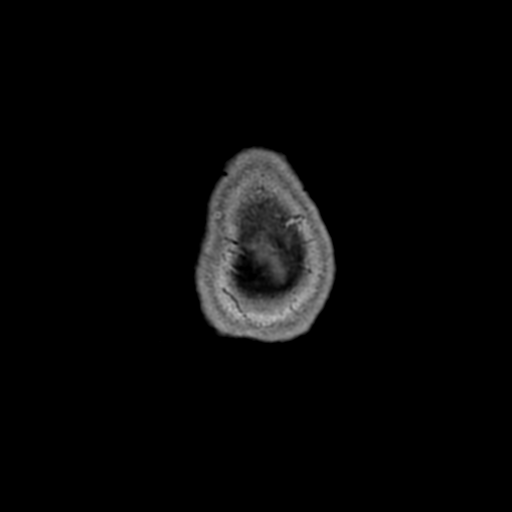
[im 295/295]
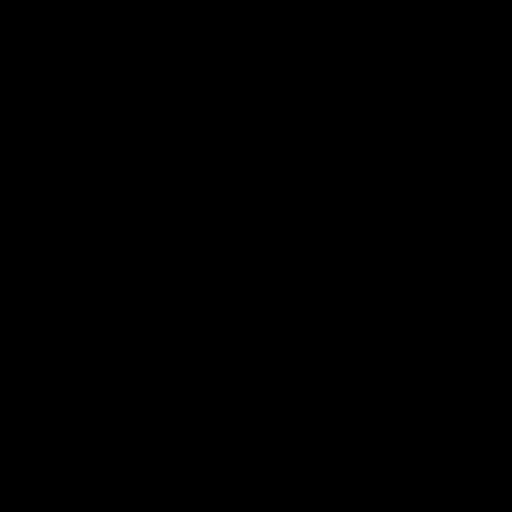

[15 of 48 positions shown; findings below may reference images not displayed]

FINDINGS: Brain: Postsurgical changes from recent left frontal craniotomy with
associated small pneumocephalus. Surgical cavity is noted in the
medial aspect of the left frontal lobe containing blood products. T2
hyperintensity with restricted diffusion along the margin of the
surgical cavity extending towards the frontal horn of the left
lateral ventricle consistent with postsurgical ischemic changes. A
small cortical area of restricted diffusion is also seen in the
anterior aspect of the left superior frontal gyrus. Remainder of the
brain parenchyma has normal signal characteristics. There is no
hydrocephalus. Postcontrast images show mild expected dural
thickening and enhancement subjacent to the area of craniotomy.

Vascular: Normal flow voids.

Skull and upper cervical spine: Postsurgical changes from left
craniotomy with adjacent scalp swelling.

Sinuses/Orbits: Negative.

Other: None.
IMPRESSION: 1. Expected postsurgical changes from medial left frontal lobe
lesion resection. Post surgical ischemic changes at the margins of
the surgical cavity precludes adequate evaluation for residual
lesion. Continued follow-up suggested.
2. Small cortical ischemia in the anterior aspect of the left
superior frontal gyrus.

## 2023-07-22 ENCOUNTER — Telehealth: Payer: Self-pay | Admitting: *Deleted

## 2023-07-22 NOTE — Telephone Encounter (Signed)
Received call from patient.  She is reporting increase and change in symptoms related to seizure like activity.  Previously all of the symptoms she felt was very surface level on her hands/feet/legs/face.  Now she describes a deeper sensation in her abdomen and cervix area with increase in frequency.  Her next scan is scheduled for September 26th with follow up October 1st,   Reports taking Lamictal 100 BID.  Routing to Dr Barbaraann Cao to see if patient should be evaluated in office sooner and/or if scan needs to be moved up.

## 2023-07-25 ENCOUNTER — Inpatient Hospital Stay: Payer: Commercial Managed Care - HMO | Attending: Internal Medicine | Admitting: Internal Medicine

## 2023-07-25 DIAGNOSIS — R569 Unspecified convulsions: Secondary | ICD-10-CM

## 2023-07-25 DIAGNOSIS — C719 Malignant neoplasm of brain, unspecified: Secondary | ICD-10-CM

## 2023-07-25 NOTE — Progress Notes (Signed)
I connected with Gabriela Herrera on 07/25/23 at 10:00 AM EDT by telephone visit and verified that I am speaking with the correct person using two identifiers.  I discussed the limitations, risks, security and privacy concerns of performing an evaluation and management service by telemedicine and the availability of in-person appointments. I also discussed with the patient that there may be a patient responsible charge related to this service. The patient expressed understanding and agreed to proceed.  Other persons participating in the visit and their role in the encounter:  n/a  Patient's location:  Home Provider's location:  Office Chief Complaint:  Diffuse astrocytoma, IDH-mutant (HCC)  Focal seizures (HCC)  History of Present Ilness: Gabriela Herrera describes "fast twitching along my ribcage and lower abdomen".  This comes and goes and lasts for seconds or minutes.  No frank seizures or other semiology c/w prior seizure events.  This has been coming and going in recent days, no associated neurologic symptoms.  Continues on Lamictal 100mg  BID.  Observations: Language and cognition at baseline  Assessment and Plan: Diffuse astrocytoma, IDH-mutant (HCC)  Focal seizures (HCC)  These may be more consistent with fasciculations, benign fasciculation syndrome.  She will try to take a video with her phone if able.  We are ok if she wants to try 1mg  PO ativan for prolonged episodes.   Will not increase LMG unless frank seizure occurs.   Follow Up Instructions:  We ask that Gabriela Herrera return to clinic in 1 months following next brain MRI, or sooner as needed.  I discussed the assessment and treatment plan with the patient.  The patient was provided an opportunity to ask questions and all were answered.  The patient agreed with the plan and demonstrated understanding of the instructions.    The patient was advised to call back or seek an in-person evaluation if the symptoms worsen or if the  condition fails to improve as anticipated.    Henreitta Leber, MD   I provided 22 minutes of non face-to-face telephone visit time during this encounter, and > 50% was spent counseling as documented under my assessment & plan.

## 2023-09-06 ENCOUNTER — Emergency Department (HOSPITAL_COMMUNITY)
Admission: EM | Admit: 2023-09-06 | Discharge: 2023-09-07 | Disposition: A | Discharge status: Home / Self Care | Payer: Commercial Managed Care - HMO | Attending: Emergency Medicine | Admitting: Emergency Medicine

## 2023-09-06 DIAGNOSIS — Y990 Civilian activity done for income or pay: Secondary | ICD-10-CM | POA: Diagnosis not present

## 2023-09-06 DIAGNOSIS — Z79899 Other long term (current) drug therapy: Secondary | ICD-10-CM | POA: Diagnosis not present

## 2023-09-06 DIAGNOSIS — W228XXA Striking against or struck by other objects, initial encounter: Secondary | ICD-10-CM | POA: Diagnosis not present

## 2023-09-06 DIAGNOSIS — I10 Essential (primary) hypertension: Secondary | ICD-10-CM | POA: Diagnosis not present

## 2023-09-06 DIAGNOSIS — S060X0A Concussion without loss of consciousness, initial encounter: Secondary | ICD-10-CM | POA: Diagnosis not present

## 2023-09-06 DIAGNOSIS — D72829 Elevated white blood cell count, unspecified: Secondary | ICD-10-CM | POA: Insufficient documentation

## 2023-09-06 DIAGNOSIS — S0990XA Unspecified injury of head, initial encounter: Secondary | ICD-10-CM | POA: Diagnosis present

## 2023-09-06 NOTE — ED Triage Notes (Signed)
She  struck her head on a piece of steel yesterday no loc  she vomited once today  . Her rt eye was partially closed yesterday  she has also had periods of blurred vision  and at work her eyes have been making her dizzy (movement)

## 2023-09-07 ENCOUNTER — Emergency Department (HOSPITAL_COMMUNITY): Payer: Commercial Managed Care - HMO

## 2023-09-07 ENCOUNTER — Other Ambulatory Visit: Payer: Self-pay

## 2023-09-07 ENCOUNTER — Encounter (HOSPITAL_COMMUNITY): Payer: Self-pay | Admitting: *Deleted

## 2023-09-07 LAB — COMPREHENSIVE METABOLIC PANEL
ALT: 24 U/L (ref 0–44)
AST: 22 U/L (ref 15–41)
Albumin: 4.1 g/dL (ref 3.5–5.0)
Alkaline Phosphatase: 57 U/L (ref 38–126)
Anion gap: 10 (ref 5–15)
BUN: 12 mg/dL (ref 6–20)
CO2: 23 mmol/L (ref 22–32)
Calcium: 9.3 mg/dL (ref 8.9–10.3)
Chloride: 104 mmol/L (ref 98–111)
Creatinine, Ser: 0.87 mg/dL (ref 0.44–1.00)
GFR, Estimated: >60 mL/min (ref >60)
Glucose, Bld: 102 mg/dL — ABNORMAL HIGH (ref 70–99)
Potassium: 3.5 mmol/L (ref 3.5–5.1)
Sodium: 137 mmol/L (ref 135–145)
Total Bilirubin: 0.5 mg/dL (ref 0.3–1.2)
Total Protein: 6.9 g/dL (ref 6.5–8.1)

## 2023-09-07 LAB — CBC
HCT: 38.4 % (ref 36.0–46.0)
Hemoglobin: 12.5 g/dL (ref 12.0–15.0)
MCH: 29.5 pg (ref 26.0–34.0)
MCHC: 32.6 g/dL (ref 30.0–36.0)
MCV: 90.6 fL (ref 80.0–100.0)
Platelets: 265 10*3/uL (ref 150–400)
RBC: 4.24 MIL/uL (ref 3.87–5.11)
RDW: 13 % (ref 11.5–15.5)
WBC: 10.9 10*3/uL — ABNORMAL HIGH (ref 4.0–10.5)
nRBC: 0 % (ref 0.0–0.2)

## 2023-09-07 LAB — HCG, SERUM, QUALITATIVE: Preg, Serum: NEGATIVE

## 2023-09-07 MED ORDER — METOCLOPRAMIDE HCL 5 MG/ML IJ SOLN
10.0000 mg | INTRAMUSCULAR | Status: AC
  Administered 2023-09-07: 10 mg via INTRAVENOUS
  Filled 2023-09-07: qty 2

## 2023-09-07 MED ORDER — METOCLOPRAMIDE HCL 10 MG PO TABS: 10.0000 mg | ORAL_TABLET | Freq: Four times a day (QID) | ORAL | 0 refills | Status: DC

## 2023-09-07 MED ORDER — ONDANSETRON 4 MG PO TBDP
8.0000 mg | ORAL_TABLET | Freq: Once | ORAL | Status: AC
  Administered 2023-09-07: 8 mg via ORAL
  Filled 2023-09-07: qty 2

## 2023-09-07 MED ORDER — DEXAMETHASONE SODIUM PHOSPHATE 10 MG/ML IJ SOLN
10.0000 mg | Freq: Once | INTRAMUSCULAR | Status: AC
  Administered 2023-09-07: 10 mg via INTRAVENOUS
  Filled 2023-09-07: qty 1

## 2023-09-07 NOTE — ED Provider Notes (Signed)
[place] EMERGENCY DEPARTMENT AT [facility] Provider Note   CSN: [id number] Arrival date & time: 09/06/23  2255     History  Chief Complaint  Patient presents with   Headache    Gabriela Herrera is a 37 y.o. female.  37 year old female with a history of hypertension, glioma s/p craniotomy, ADHD presents to the emergency department for evaluation of head injury.  She stood up into a piece of steel pipe yesterday.  She states that her vision went black for a second, but she had no loss of consciousness.  She has since been experiencing intermittent headaches as well as dizziness, intermittent blurred vision.  She reports some weakness of her right eyelid yesterday which has largely improved.  She had some nausea developed this morning, 1 episode of emesis this afternoon.  Her emesis has improved with Zofran given in triage.  She denies taking any other medications prior to arrival.  The patient is not chronically anticoagulated.  No extremity numbness or weakness.  She was advised to be evaluated by her oncologist.  The history is provided by the patient. No language interpreter was used.  Headache      Home Medications Prior to Admission medications   Medication Sig Start Date End Date Taking? Authorizing Provider  metoCLOPramide (REGLAN) 10 MG tablet Take 1 tablet (10 mg total) by mouth every 6 (six) hours. 09/07/23  Yes Antony Madura, PA-C  lamoTRIgine (LAMICTAL) 100 MG tablet TAKE ONE TABLET BY MOUTH TWICE A DAY 03/05/23   Vaslow, Georgeanna Lea, MD  lisinopril-hydrochlorothiazide (ZESTORETIC) 10-12.5 MG tablet Take 1 tablet by mouth daily. 03/05/20   [provider]  LORazepam (ATIVAN) 1 MG tablet Take 1 tablet (1 mg total) by mouth once as needed for up to 1 dose for anxiety (MRI anxiety). 08/21/22   Vaslow, Georgeanna Lea, MD  montelukast (SINGULAIR) 10 MG tablet Take 1 tablet by mouth daily. 05/23/22   [provider]  Multiple Vitamin (MULTIVITAMIN WITH MINERALS)  TABS tablet Take 2 tablets by mouth daily.    [provider]  pantoprazole (PROTONIX) 20 MG tablet Take 1 tablet (20 mg total) by mouth daily. Patient taking differently: Take 40 mg by mouth daily. 01/13/18   Antony Madura, PA-C      Allergies    Bee venom, Codeine, Hydromorphone, Dog epithelium (canis lupus familiaris), Gluten meal, Keppra [levetiracetam], Other, Promethazine hcl, and Tramadol    Review of Systems   Review of Systems  Neurological:  Positive for headaches.  Ten systems reviewed and are negative for acute change, except as noted in the HPI.    Physical Exam Updated Vital Signs BP 123/75 (BP Location: Right Arm)   Pulse 62   Temp 98.1 F (36.7 C) (Oral)   Resp 16   Ht 5\' 7"  (1.702 m)   Wt 93.9 kg   LMP  (LMP Unknown)   SpO2 100%   BMI 32.42 kg/m   Physical Exam Vitals and nursing note reviewed.  Constitutional:      General: She is not in acute distress.    Appearance: She is well-developed. She is not diaphoretic.     Comments: Nontoxic appearing and in NAD  HENT:     Head: Normocephalic and atraumatic.     Comments: No Battle sign or raccoon's eyes Eyes:     General: No scleral icterus.    Extraocular Movements: EOM normal.     Conjunctiva/sclera: Conjunctivae normal.  Neck:     Comments: No nuchal rigidity  or meningismus Cardiovascular:     Rate and Rhythm: Normal rate and regular rhythm.     Pulses: Normal pulses.  Pulmonary:     Effort: Pulmonary effort is normal. No respiratory distress.     Comments: Respirations even and unlabored Musculoskeletal:        General: Normal range of motion.     Cervical back: Normal range of motion.  Skin:    General: Skin is warm and dry.     Coloration: Skin is not pale.     Findings: No erythema or rash.  Neurological:     General: No focal deficit present.     Mental Status: She is alert and oriented to person, place, and time.     Cranial Nerves: No cranial nerve deficit.     Coordination:  Coordination normal.     Comments: GCS 15.  Speech is clear, goal oriented.  No focal deficits appreciated.  Moving all extremities spontaneously.   Psychiatric:        Mood and Affect: Mood and affect normal.        Behavior: Behavior normal.     ED Results / Procedures / Treatments   Labs (all labs ordered are listed, but only abnormal results are displayed) Labs Reviewed  COMPREHENSIVE METABOLIC PANEL - Abnormal; Notable for the following components:      Result Value   Glucose, Bld 102 (*)    All other components within normal limits  CBC - Abnormal; Notable for the following components:   WBC 10.9 (*)    All other components within normal limits  HCG, SERUM, QUALITATIVE    EKG None  Radiology CT Head Wo Contrast  Result Date: 09/07/2023 CLINICAL DATA:  Head trauma EXAM: CT HEAD WITHOUT CONTRAST TECHNIQUE: Contiguous axial images were obtained from the base of the skull through the vertex without intravenous contrast. RADIATION DOSE REDUCTION: This exam was performed according to the departmental dose-optimization program which includes automated exposure control, adjustment of the mA and/or kV according to patient size and/or use of iterative reconstruction technique. COMPARISON:  None Available. FINDINGS: Brain: There is no mass, hemorrhage or extra-axial collection. The size and configuration of the ventricles and extra-axial CSF spaces are normal. The brain parenchyma is normal, without acute or chronic infarction. There is a punctate hyperdense focus on series 3, image 20 that appears to propagate in the transverse direction across the scan and is favored to be an artifact. There is encephalomalacia of the anterior left frontal lobe, likely postoperative. Vascular: No abnormal hyperdensity of the major intracranial arteries or dural venous sinuses. No intracranial atherosclerosis. Skull: Remote left frontal craniotomy. Sinuses/Orbits: No fluid levels or advanced mucosal  thickening of the visualized paranasal sinuses. No mastoid or middle ear effusion. The orbits are normal. IMPRESSION: 1. No acute intracranial abnormality. 2. Remote left frontal craniotomy with encephalomalacia of the anterior left frontal lobe, likely postoperative. Electronically Signed   By: Deatra Robinson M.D.   On: 09/07/2023 02:41    Procedures Procedures    Medications Ordered in ED Medications  ondansetron (ZOFRAN-ODT) disintegrating tablet 8 mg (8 mg Oral Given 09/07/23 0021)  metoCLOPramide (REGLAN) injection 10 mg (10 mg Intravenous Given 09/07/23 0437)  dexamethasone (DECADRON) injection 10 mg (10 mg Intravenous Given 09/07/23 0437)    ED Course/ Medical Decision Making/ A&P Clinical Course as of 09/07/23 0505  Sat Sep 07, 2023  0434 Symptoms are very characteristic of likely concussion.  Will treat symptomatically in the emergency department with  medications.  Otherwise vitals have been reassuring.  She has no focal deficits noted on exam.  Her head CT shows stable postoperative changes.  No acute intracranial abnormality. [KH]  0501 Patient feeling symptomatically improved following medications.  Is requesting discharge. [KH]    Clinical Course User Index [KH] Antony Madura, PA-C                                 Medical Decision Making Risk Prescription drug management.   This patient presents to the ED for concern of head injury, this involves an extensive number of treatment options, and is a complaint that carries with it a high risk of complications and morbidity.  The differential diagnosis includes ICH vs skull fx vs concussion vs traumatic hydrocephalus vs contusion   Co morbidities that complicate the patient evaluation  HTN Glioma s/p craniotomy   Additional history obtained:  Additional history obtained from partner, at bedside External records from outside source obtained and reviewed including postoperative MRI on 03/16/23   Lab Tests:  I Ordered, and  personally interpreted labs.  The pertinent results include:  Mild leukocytosis of 10.9 (chronic, stable). Otherwise normal labs.   Imaging Studies ordered:  I ordered imaging studies including CT head  I independently visualized and interpreted imaging which showed no acute intracranial abnormality I agree with the radiologist interpretation   Cardiac Monitoring:  The patient was maintained on a cardiac monitor.  I personally viewed and interpreted the cardiac monitored which showed an underlying rhythm of: NSR   Medicines ordered and prescription drug management:  I ordered medication including Reglan and Decadron for headache  Reevaluation of the patient after these medicines showed that the patient improved I have reviewed the patients home medicines and have made adjustments as needed   Problem List / ED Course:  As above   Reevaluation:  After the interventions noted above, I reevaluated the patient and found that they have :improved   Social Determinants of Health:  Good social support   Dispostion:  After consideration of the diagnostic results and the patients response to treatment, I feel that the patent would benefit from concussive precautions x 1 week with PCP f/u for recheck to be cleared from precautions. Given short Rx of Reglan for outpatient nausea management. Return precautions discussed and provided. Patient discharged in stable condition with no unaddressed concerns.          Final Clinical Impression(s) / ED Diagnoses Final diagnoses:  Concussion without loss of consciousness, initial encounter    Rx / DC Orders ED Discharge Orders          Ordered    metoCLOPramide (REGLAN) 10 MG tablet  Every 6 hours        09/07/23 0503              Antony Madura, PA-C 09/07/23 0509    Sabas Sous, MD 09/07/23 [phone]

## 2023-09-07 NOTE — ED Notes (Signed)
Pt provided with AVS.  Education complete; all questions answered.  Pt leaving ED in stable condition at this time, ambulatory with all belongings. 

## 2023-09-07 NOTE — ED Triage Notes (Signed)
Hx of a brain tumor removal last may

## 2023-09-07 NOTE — ED Notes (Signed)
Pt discussed with dr Clayborne Dana  orders per dr Clayborne Dana

## 2023-09-07 NOTE — Discharge Instructions (Addendum)
You had a normal neurologic exam while in the emergency department.  This is reassuring.  It is possible that you may have a mild concussion.  A concussion is a diagnosis that is made clinically and does not show any abnormal results on imaging such as a CT scan or MRI.  Your CT today showed only expected postoperative changes associated with your prior surgery.  A concussion may cause a persistent headache over the next few days. This can be brought on or worsened by loud sounds or bright lights. Try to avoid excessive use of cell phones, television, video games as this may worsening headaches.  Avoid strenuous activity and heavy lifting over the next few days.  Do not drive a motor vehicle or operate heavy machinery for the next week.  We advise follow-up with your primary care doctor in 1 week to be cleared from concussion precautions.  If you develop severe worsening of your headache, vision changes or loss, uncontrolled vomiting, numbness or tingling to one side of your body, difficulty walking or lifting your arms or legs, return promptly to the emergency department for repeat evaluation.

## 2023-09-07 NOTE — ED Notes (Signed)
Pt states she is feeling better following medications.  Pt to be discharged shortly per PA.

## 2023-09-08 ENCOUNTER — Other Ambulatory Visit: Payer: Self-pay | Admitting: Internal Medicine

## 2023-09-09 ENCOUNTER — Encounter: Payer: Self-pay | Admitting: Internal Medicine

## 2023-09-09 ENCOUNTER — Telehealth: Payer: Self-pay | Admitting: *Deleted

## 2023-09-09 NOTE — Telephone Encounter (Signed)
Contacted patient after receiving fax from after hours triage service - copy of report was scanned into patient's chart. Summary-Patient called them on 09/06/23 2193 to report hitting her head 09/05/23 on steel plate when standing up. Stated everything went black and she had been nauseous and dizzy since event. Triage service advised ED.  Patient went to [facility] ED 09/06/23 following call to Triage service. Patient had CT while in ED.  Was d/c'd from ED 09/07/23 0530 with short course of reglan for nausea and f/u recommended to PCP in 1 week.  Patient did not answer. LVM that office received notice of ED visit. Reminded patient of MRI 09/12/23 and appt with Dr. Barbaraann Cao 09/17/23. Encouraged to contact office for concerns

## 2023-09-12 ENCOUNTER — Ambulatory Visit
Admission: RE | Admit: 2023-09-12 | Discharge: 2023-09-12 | Disposition: A | Discharge status: Home / Self Care | Payer: Commercial Managed Care - HMO | Source: Ambulatory Visit | Attending: Internal Medicine | Admitting: Internal Medicine

## 2023-09-12 DIAGNOSIS — C719 Malignant neoplasm of brain, unspecified: Secondary | ICD-10-CM

## 2023-09-12 MED ORDER — GADOPICLENOL 0.5 MMOL/ML IV SOLN
9.0000 mL | Freq: Once | INTRAVENOUS | Status: AC | PRN
  Administered 2023-09-12: 9 mL via INTRAVENOUS

## 2023-09-17 ENCOUNTER — Inpatient Hospital Stay: Payer: Commercial Managed Care - HMO | Attending: Internal Medicine | Admitting: Internal Medicine

## 2023-09-17 VITALS — BP 114/73 | HR 42 | Temp 97.5°F | Resp 20 | Wt 211.6 lb

## 2023-09-17 DIAGNOSIS — C719 Malignant neoplasm of brain, unspecified: Secondary | ICD-10-CM | POA: Diagnosis not present

## 2023-09-17 DIAGNOSIS — C711 Malignant neoplasm of frontal lobe: Secondary | ICD-10-CM | POA: Insufficient documentation

## 2023-09-17 DIAGNOSIS — R569 Unspecified convulsions: Secondary | ICD-10-CM
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
## 2024-02-18 ENCOUNTER — Ambulatory Visit
Admission: RE | Admit: 2024-02-18 | Discharge: 2024-02-18 | Disposition: A | Discharge status: Home / Self Care | Source: Ambulatory Visit | Attending: Internal Medicine | Admitting: Internal Medicine

## 2024-02-18 DIAGNOSIS — C719 Malignant neoplasm of brain, unspecified: Secondary | ICD-10-CM

## 2024-02-18 MED ORDER — GADOPICLENOL 0.5 MMOL/ML IV SOLN
10.0000 mL | Freq: Once | INTRAVENOUS | Status: AC | PRN
  Administered 2024-02-18: 10 mL via INTRAVENOUS

## 2024-02-19 ENCOUNTER — Telehealth: Payer: Self-pay | Admitting: Internal Medicine

## 2024-02-21 ENCOUNTER — Other Ambulatory Visit: Payer: Commercial Managed Care - HMO

## 2024-02-24 ENCOUNTER — Inpatient Hospital Stay: Attending: Internal Medicine | Admitting: Internal Medicine

## 2024-02-24 VITALS — BP 117/60 | HR 64 | Temp 97.6°F | Resp 13 | Wt 224.6 lb

## 2024-02-24 DIAGNOSIS — C711 Malignant neoplasm of frontal lobe: Secondary | ICD-10-CM | POA: Diagnosis not present

## 2024-02-24 DIAGNOSIS — C719 Malignant neoplasm of brain, unspecified: Secondary | ICD-10-CM

## 2024-02-24 DIAGNOSIS — R569 Unspecified convulsions: Secondary | ICD-10-CM | POA: Insufficient documentation

## 2024-02-24 DIAGNOSIS — Z79899 Other long term (current) drug therapy: Secondary | ICD-10-CM | POA: Diagnosis not present

## 2024-02-24 MED ORDER — LAMOTRIGINE 100 MG PO TABS: 100.0000 mg | ORAL_TABLET | Freq: Two times a day (BID) | ORAL | 1 refills | Status: DC
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
## 2024-02-25 ENCOUNTER — Telehealth: Payer: Self-pay | Admitting: Internal Medicine

## 2024-02-25 NOTE — Telephone Encounter (Signed)
 Completed - Patient scheduled appointments. Patient is aware of all appointment details.

## 2024-03-17 ENCOUNTER — Ambulatory Visit: Payer: Commercial Managed Care - HMO | Admitting: Internal Medicine

## 2024-07-03 ENCOUNTER — Encounter: Payer: Self-pay | Admitting: Internal Medicine

## 2024-07-16 ENCOUNTER — Telehealth: Payer: Self-pay | Admitting: *Deleted

## 2024-07-16 NOTE — Telephone Encounter (Signed)
 PC received from patient - she states she has had focal seizures for years, now she has been having internal seizures for the last year.  She states she usually has seizures in her arms, legs, face - now she is feeling them internally, in her L side, abdomen, vaginally (she has an appointment with her GYN for this).  She continues to take Lamictal  100 mg twice a day.  She is asking for call back with recommendation.  Forwarded to LITTIE Silversmith NP

## 2024-07-20 ENCOUNTER — Other Ambulatory Visit: Payer: Self-pay | Admitting: Internal Medicine

## 2024-07-20 ENCOUNTER — Telehealth: Payer: Self-pay | Admitting: *Deleted

## 2024-07-20 DIAGNOSIS — R569 Unspecified convulsions: Secondary | ICD-10-CM

## 2024-07-20 MED ORDER — LAMOTRIGINE 150 MG PO TABS
150.0000 mg | ORAL_TABLET | Freq: Two times a day (BID) | ORAL | 2 refills | Status: DC
Start: 2024-07-20 — End: 2024-10-15

## 2024-07-20 NOTE — Telephone Encounter (Signed)
 PC to patient, informed her Dr Eward recommendation is for her to increase her Lamictal  to 150 mg twice a day, a prescription has been sent in. She is to stop her 100 mg dose.  Patient instructed to contact this office if no improvement in focal seizures in the next 1-2 weeks.  She verbalizes understanding.

## 2024-08-20 ENCOUNTER — Inpatient Hospital Stay: Admission: RE | Admit: 2024-08-20 | Source: Ambulatory Visit

## 2024-08-20 ENCOUNTER — Telehealth: Payer: Self-pay | Admitting: *Deleted

## 2024-08-20 ENCOUNTER — Telehealth: Payer: Self-pay | Admitting: Internal Medicine

## 2024-08-20 NOTE — Telephone Encounter (Signed)
 Canceled appointment per patients request via incoming call. Talked with the patient and she is aware of the changes made to her upcoming appointment. Patient states she is having difficulty with getting her MRI scheduled. Patient was transferred to the nurse to get more information about the MRI time line.

## 2024-08-20 NOTE — Telephone Encounter (Signed)
 Returned PC to patient, she called earlier to say she canceled her MRI appointment today & F/U MD appointment because her insurance says she hasn't met her deductible & MRI will cost approximately $1800.  Number to patient accounting department given, she is calling to get information about possible assistance.  Dr Buckley informed.

## 2024-08-24 ENCOUNTER — Ambulatory Visit: Admitting: Internal Medicine

## 2024-10-14 ENCOUNTER — Other Ambulatory Visit: Payer: Self-pay | Admitting: Internal Medicine

## 2024-10-14 DIAGNOSIS — R569 Unspecified convulsions: Secondary | ICD-10-CM

## 2024-12-27 ENCOUNTER — Ambulatory Visit
Admission: RE | Admit: 2024-12-27 | Discharge: 2024-12-27 | Disposition: A | Source: Ambulatory Visit | Attending: Internal Medicine

## 2024-12-27 DIAGNOSIS — C719 Malignant neoplasm of brain, unspecified: Secondary | ICD-10-CM

## 2024-12-27 MED ORDER — GADOPICLENOL 0.5 MMOL/ML IV SOLN
10.0000 mL | Freq: Once | INTRAVENOUS | Status: AC | PRN
  Administered 2024-12-27: 10 mL via INTRAVENOUS

## 2024-12-28 ENCOUNTER — Telehealth: Payer: Self-pay | Admitting: Internal Medicine

## 2024-12-28 NOTE — Telephone Encounter (Signed)
 Scheduled patient for a scan review follow-up. Called and spoke with the patient, she is aware.

## 2024-12-29 ENCOUNTER — Inpatient Hospital Stay: Attending: Internal Medicine | Admitting: Internal Medicine

## 2024-12-29 ENCOUNTER — Telehealth: Payer: Self-pay | Admitting: Pharmacist

## 2024-12-29 VITALS — BP 121/56 | HR 54 | Temp 98.0°F | Resp 18 | Ht 67.0 in | Wt 223.0 lb

## 2024-12-29 DIAGNOSIS — Z1509 Genetic susceptibility to other malignant neoplasm: Secondary | ICD-10-CM | POA: Diagnosis not present

## 2024-12-29 DIAGNOSIS — C719 Malignant neoplasm of brain, unspecified: Secondary | ICD-10-CM | POA: Diagnosis not present

## 2024-12-29 DIAGNOSIS — R569 Unspecified convulsions: Secondary | ICD-10-CM | POA: Diagnosis not present

## 2024-12-29 MED ORDER — VORASIDENIB 40 MG PO TABS
40.0000 mg | ORAL_TABLET | Freq: Every day | ORAL | 2 refills | Status: AC
  Filled 2024-12-30: qty 30, 30d supply, fill #0
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
## 2024-12-29 NOTE — Telephone Encounter (Signed)
 Oral Oncology Pharmacist Encounter  Received new prescription for Voranigo  (vorasidenib ) for the treatment of grade 2 astrocytoma, IDH1 mutated, planned duration until disease progression or unacceptable drug toxicity.  Labs from 11/18/24 assessed, no relevant lab abnormalities requiring baseline dose adjustment required at this time. Prescription dose and frequency assessed for appropriateness.  Current medication list in Epic reviewed, no relevant/significant DDIs with Voranigo  identified.  Evaluated chart and no patient barriers to medication adherence noted.   Patient agreement for treatment documented in MD note on 12/29/24.  Prescription has been e-scribed to the [facility] for benefits analysis and approval.  Oral Oncology Clinic will continue to follow for insurance authorization, copayment issues, initial counseling and start date.  Asberry Macintosh, PharmD, BCPS, BCOP Hematology/Oncology Clinical Pharmacist [phone] 12/29/2024 6:31 PM

## 2024-12-30 ENCOUNTER — Other Ambulatory Visit: Payer: Self-pay

## 2024-12-30 ENCOUNTER — Encounter: Payer: Self-pay | Admitting: Pharmacist

## 2024-12-30 ENCOUNTER — Telehealth: Payer: Self-pay

## 2024-12-30 ENCOUNTER — Other Ambulatory Visit (HOSPITAL_COMMUNITY): Payer: Self-pay

## 2024-12-30 DIAGNOSIS — C719 Malignant neoplasm of brain, unspecified: Secondary | ICD-10-CM

## 2024-12-30 NOTE — Progress Notes (Signed)
 Oral Chemotherapy Pharmacist Encounter  Patient was counseled under telephone encounter from 12/30/24.  Asberry Macintosh, PharmD, BCPS, BCOP Hematology/Oncology Clinical Pharmacist [phone] 12/30/2024 11:35 AM

## 2024-12-30 NOTE — Telephone Encounter (Signed)
 Oral Oncology Patient Advocate Encounter  After completing a benefits investigation, prior authorization for vorasidenib  (VORANIGO ) 40 MG tablet  is not required at this time through [facility].  Patient's copay is $4.00.     Lucie Lamer, CPhT [place]  [facility] Oncology Pharmacy Patient Advocate Specialist [address] [place] Phone: [phone]  Fax: [phone] [email or web address]

## 2024-12-30 NOTE — Progress Notes (Signed)
 Oral Chemotherapy Pharmacist Encounter  I spoke with patient for overview of new oral chemotherapy medication: Voranigo  (vorasidenib ) for the treatment of grade 2 astrocytoma, IDH1 mutated, planned duration until disease progression or unacceptable drug toxicity.   Treatment goal: Control  Counseled patient on administration, dosing, side effects, monitoring, drug-food interactions, safe handling, storage, and disposal.  Patient will take Voranigo  40 mg tablets, 1 tablet by mouth daily. Patient may take it with or without food.   Start date: 01/04/25  Side effects include but not limited to: increased LFTs, fatigue, myalgias, diarrhea.     Reviewed with patient importance of keeping a medication schedule and plan for any missed doses.  After discussion with patient no patient barriers to medication adherence identified.   Distress thermometer completed during telephone call. Due to score, social work referral has been sent.  Communication and Learning Assessment Primary learner: Patient Barriers to learning: No barriers Preferred language: English Learning preferences: Listening Reading  Gabriela Herrera voiced understanding and appreciation. All questions answered. Medication handout provided.  Provided patient with Oral Chemotherapy Navigation Clinic phone number. Patient knows to call the office with questions or concerns.  Gabriela Herrera, PharmD, BCPS, BCOP Hematology/Oncology Clinical Pharmacist [phone] 12/30/2024 11:22 AM

## 2024-12-30 NOTE — Patient Instructions (Signed)
 [facility] A [facility] OF [facility]  Thank you for choosing [facility] to provide your oncology/hematology care and for allowing us  to participate in your care today!  As a reminder, we spoke about the following today: Voranigo  (vorasidenib ) for the treatment of grade 2 astrocytoma, IDH1 mutated, planned duration until disease progression or unacceptable drug toxicity.   Treatment goal: Control  Medication handout has been provided.   **For oral cancer medication prescription refill requests, contact your pharmacy and they will contact our office if needed. Allow 5-7 days for refills to be completed by your specialty pharmacy.    Cancer Center General Instructions:  If you have an appointment at the [facility], please go directly to the Cancer Center and check in at the registration area.  We strive to give you quality time with your provider. You may need to reschedule your appointment if you arrive late (15 or more minutes).  Arriving late affects you and other patients whose appointments are after yours.  Also, if you miss three or more appointments without notifying the office, you may be dismissed from the clinic at the provider's discretion.      BELOW ARE SYMPTOMS THAT SHOULD BE REPORTED IMMEDIATELY: *FEVER GREATER THAN 100.4 F (38 C) OR HIGHER *CHILLS OR SWEATING *NAUSEA AND VOMITING THAT IS NOT CONTROLLED WITH YOUR NAUSEA MEDICATION *UNUSUAL SHORTNESS OF BREATH *UNUSUAL BRUISING OR BLEEDING *URINARY PROBLEMS (pain or burning when urinating, or frequent urination) *BOWEL PROBLEMS (unusual diarrhea, constipation, pain near the anus) TENDERNESS IN MOUTH AND THROAT WITH OR WITHOUT PRESENCE OF ULCERS (sore throat, sores in mouth, or a toothache) UNUSUAL RASH, SWELLING OR PAIN  UNUSUAL VAGINAL DISCHARGE OR ITCHING   Items with * indicate a potential emergency and should be followed up as soon as possible or go to the Emergency  Department if any problems should occur.  Please show the CHEMOTHERAPY ALERT CARD at check-in to the Emergency Department and triage nurse.  Should you have questions after your visit or need to cancel or reschedule your appointment, please contact [facility] [place] - A DEPT OF [facility]  [phone] and follow the prompts.  Office hours are 8:00 a.m. to 4:30 p.m. Monday - Friday. Please note that voicemails left after 4:00 p.m. may not be returned until the following business day.  We are closed weekends and major holidays. You have access to a nurse at all times for urgent questions. Please call the main number to the clinic [phone] and follow the prompts.  For any non-urgent questions, you may also contact your provider using MyChart. We now offer e-Visits for anyone 56 and older to request care online for non-urgent symptoms. For details visit [email or web address].   Also download the MyChart app! Go to the app store, search MyChart, open the app, select [facility], and log in with your MyChart username and password.

## 2024-12-30 NOTE — Progress Notes (Signed)
 Specialty Pharmacy Initial Fill Coordination Note  Gabriela Herrera is a 39 y.o. female contacted today regarding refills of specialty medication(s) Vorasidenib  BERNARDINE) .  Patient requested [name] at [facility] [place]  on 12/31/24   Medication will be filled on 12/30/24.   Patient is aware of $4 copayment.

## 2024-12-31 ENCOUNTER — Encounter (HOSPITAL_COMMUNITY): Payer: Self-pay

## 2024-12-31 ENCOUNTER — Other Ambulatory Visit (HOSPITAL_COMMUNITY): Payer: Self-pay

## 2024-12-31 ENCOUNTER — Other Ambulatory Visit: Payer: Self-pay

## 2025-01-01 ENCOUNTER — Inpatient Hospital Stay: Admitting: Licensed Clinical Social Worker

## 2025-01-01 ENCOUNTER — Other Ambulatory Visit: Payer: Self-pay

## 2025-01-01 DIAGNOSIS — Z1509 Genetic susceptibility to other malignant neoplasm: Secondary | ICD-10-CM

## 2025-01-01 NOTE — Progress Notes (Signed)
 [facility] Psychosocial Distress Screening Clinical Social Work  Gabriela Herrera is a 39 y.o. year old female. Clinical Social Work was referred by nurse for positive distress screening. The patient scored a 8 on the Psychosocial Distress Thermometer which indicates severe distress. Clinical Social Worker contacted patient by phone to assess for distress and other psychosocial needs.     Distress Screen:    12/30/2024   11:00 AM  ONCBCN DISTRESS SCREENING  Screening Type Initial Screening  How much distress have you been experiencing in the past week? (0-10) 8  Emotional concerns type Worry or anxiety     Interventions: Pt reporting increased anxiety as treatment was difficult at time of diagnosis and she had hoped to not have to restart treatment.  CSW provided opportunity and space for pt to express her feelings regarding her current circumstances.  Pt aware of availability of counseling for continued support.  Pt also expressing financial concerns if she is unable to work during treatment.  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  CSW discussed the difference between SSI and SSDI, encouraging pt to apply for SSI at this time as well as SNAP benefits.  Pt will start treatment and determine based on tolerance if she should apply for SSDI.  CSW went over additional financial resources which may be available including the [employer] and the [employer].    CSW and patient discussed common feeling and emotions when being diagnosed with cancer, and the importance of support during treatment.  CSW informed patient of the support team and support services at [facility].  CSW provided contact information and encouraged patient to call with any questions or concerns.   Follow Up Plan: Patient will contact CSW with any support or resource needs Patient verbalizes understanding of plan: Yes    [name], LCSW     Patient is participating in a Managed Medicaid  Plan:  Yes

## 2025-01-04 ENCOUNTER — Telehealth: Payer: Self-pay | Admitting: Pharmacist

## 2025-01-04 ENCOUNTER — Other Ambulatory Visit: Payer: Self-pay | Admitting: Internal Medicine

## 2025-01-04 DIAGNOSIS — R569 Unspecified convulsions: Secondary | ICD-10-CM

## 2025-01-04 DIAGNOSIS — Z789 Other specified health status: Secondary | ICD-10-CM

## 2025-01-04 NOTE — Telephone Encounter (Signed)
 Oral Oncology Pharmacist Encounter  Reached out to patient at request of [facility] at [facility] as patient had additional questions regarding supplements she is taking. During the phone call we reviewed the following supplement medications that are not on her medication list: CBG (cannabigerol) - would not recommend concomitant use with Voranigo  as CBG may increase risk of side effects of Voranigo  via CYP3A4 inhibition.  CBN (cannabinol) - unable to find specific information that this specific cannabinoid could interact with Voranigo . Some trials report that CBN may competitively inhibit CYP2B6, 2C9 and 2E1 (Voranigo  is a minor substrate of CYP2B6 and CYP2C9, so based on pharmacokinetics there could be risk for increased side effects from Voranigo  if used with CBN). Patient states she uses this for sleep, when discussing alternative supplement for sleep she states she has tried melatonin and this does not work and she does not want a prescription sleep aid.  Green tea - OK for use with Voranigo  Vitamin B12 - OK for use with Voranigo  Vitamin D3 - OK for use with Voranigo  Pineapple gummy for female pH balance - OK for use with Voranigo  Omega 3 fatty acids - OK for use with Voranigo  Midol  - would recommend avoiding if patient uses it around the clock (which she states she does during her period) due to APAP component that can can increase risk for liver toxicity. When I discussed ibuprofen  as a PRN alternative for patient, patient stated she read online that ibuprofen  can decrease efficacy of Voranigo . I can not find evidence supporting this claim from the manufacturer.   At end of call patient requested further discussion with Dr. Buckley regarding her specific case and Voranigo  due to her concerns of side effects of the medication and frustrations of being limited with CBD-product options to take with Voranigo . MD and RN notified.   Asberry Macintosh, PharmD, BCPS,  BCOP Hematology/Oncology Clinical Pharmacist [phone] 01/04/2025 9:37 AM

## 2025-01-05 ENCOUNTER — Inpatient Hospital Stay: Admitting: Internal Medicine

## 2025-01-05 DIAGNOSIS — C719 Malignant neoplasm of brain, unspecified: Secondary | ICD-10-CM | POA: Diagnosis not present

## 2025-01-05 DIAGNOSIS — Z1509 Genetic susceptibility to other malignant neoplasm: Secondary | ICD-10-CM | POA: Diagnosis not present

## 2025-01-05 DIAGNOSIS — R569 Unspecified convulsions: Secondary | ICD-10-CM

## 2025-01-05 NOTE — Progress Notes (Signed)
 I connected with Gabriela Herrera on 01/05/25 at  3:00 PM EST by telephone visit and verified that I am speaking with the correct person using two identifiers.  I discussed the limitations, risks, security and privacy concerns of performing an evaluation and management service by telemedicine and the availability of in-person appointments. I also discussed with the patient that there may be a patient responsible charge related to this service. The patient expressed understanding and agreed to proceed.   Other persons participating in the visit and their role in the encounter:  n/a   Patient's location:  Home Provider's location:  Office Chief Complaint:  Diffuse astrocytoma, IDH-mutant (HCC)  Focal seizures (HCC)  History of Present Ilness: Gabriela Herrera reports no clinical changes today.  She expresses multiple questions and concerns regarding the voranigo .  Still having sporadic headaches, continues on Lamictal  150mg  twice per day.  Observations: Language and cognition at baseline  Assessment and Plan: Diffuse astrocytoma, IDH-mutant (HCC)  Focal seizures (HCC)  We addressed goals of care in detail today, with regards to her progressive grade 2 glioma.  We counseled her on the risks and benefits of proceeding with the Voranigo  therapy, with the stated goal of preventing further tumor infiltration and high grade transformation.  She understands the alternative to Voranigo  would be either additional surgery, or radiation with chemotherapy.  She is agreeable to proceed with treatment and will plan to start tomorrow.  First lab check scheduled for 01/28/25.  Follow Up Instructions: RTC as scheduled in 3 weeks with labs for review  I discussed the assessment and treatment plan with the patient.  The patient was provided an opportunity to ask questions and all were answered.  The patient agreed with the plan and demonstrated understanding of the instructions.    The patient was advised to call  back or seek an in-person evaluation if the symptoms worsen or if the condition fails to improve as anticipated.    [name], MD   I provided 20 minutes of non face-to-face telephone visit time during this encounter, and > 50% was spent counseling as documented under my assessment & plan.

## 2025-01-15 ENCOUNTER — Telehealth: Payer: Self-pay | Admitting: *Deleted

## 2025-01-15 NOTE — Telephone Encounter (Signed)
 Ms. Saunders called. She was applying  to SSI and SSDI as recommended by SW.  She called with concerns about SSI and SSDI application questions related to work from the medical perspective:  what is she able to do right now and when will she have to stop doing it or doing anything like that.   She said she is still very active with a physically demanding job but between symptoms of her condition and medication side effects, she is not able to do all aspects of her job.  Ms. Schuld asked if Dr. Buckley would be able to provide any idea of a timeline.

## 2025-01-19 ENCOUNTER — Other Ambulatory Visit: Payer: Self-pay

## 2025-01-19 ENCOUNTER — Inpatient Hospital Stay: Admitting: Licensed Clinical Social Worker

## 2025-01-19 DIAGNOSIS — Z1509 Genetic susceptibility to other malignant neoplasm: Secondary | ICD-10-CM

## 2025-01-19 NOTE — Progress Notes (Signed)
 [facility] CSW Progress Note   Interventions: CSW contacted pt regarding SSI and SNAP application questions.  Pt requesting a letter from the cancer center verifying diagnosis and pt symptoms to present to DSS w/ SNAP benefit application.  Letter drafted and emailed to pt.  CSW spoke at length w/ pt regarding both applications and the information required for approval.      Follow Up Plan:  Patient will contact CSW with any support or resource needs    Gabriela JONELLE Manna, LCSW Clinical Social Worker [facility]    Patient is participating in a Managed Medicaid Plan:  Yes

## 2025-01-28 ENCOUNTER — Inpatient Hospital Stay: Admitting: Internal Medicine

## 2025-01-28 ENCOUNTER — Inpatient Hospital Stay
